# Patient Record
Sex: Male | Born: 1970
Health system: Southern US, Community
[De-identification: ages and names within clinical notes are randomized; demographics above are authoritative.]

## PROBLEM LIST (undated history)

## (undated) DIAGNOSIS — T7840XA Allergy, unspecified, initial encounter: Secondary | ICD-10-CM

## (undated) DIAGNOSIS — J189 Pneumonia, unspecified organism: Secondary | ICD-10-CM

## (undated) DIAGNOSIS — K589 Irritable bowel syndrome without diarrhea: Secondary | ICD-10-CM

## (undated) DIAGNOSIS — T07XXXA Unspecified multiple injuries, initial encounter: Secondary | ICD-10-CM

## (undated) DIAGNOSIS — N200 Calculus of kidney: Secondary | ICD-10-CM

## (undated) HISTORY — DX: Unspecified multiple injuries, initial encounter: T07.XXXA

## (undated) HISTORY — DX: Calculus of kidney: N20.0

## (undated) HISTORY — DX: Irritable bowel syndrome, unspecified: K58.9

## (undated) HISTORY — PX: TONSILLECTOMY: SUR1361

## (undated) HISTORY — DX: Allergy, unspecified, initial encounter: T78.40XA

## (undated) HISTORY — DX: Pneumonia, unspecified organism: J18.9

---

## 2001-04-17 ENCOUNTER — Emergency Department (HOSPITAL_COMMUNITY): Admission: EM | Admit: 2001-04-17 | Discharge: 2001-04-17 | Payer: Self-pay | Admitting: Internal Medicine

## 2001-04-17 ENCOUNTER — Encounter: Payer: Self-pay | Admitting: Internal Medicine

## 2001-04-22 ENCOUNTER — Encounter: Payer: Self-pay | Admitting: Orthopedic Surgery

## 2001-04-22 ENCOUNTER — Ambulatory Visit (HOSPITAL_COMMUNITY): Admission: RE | Admit: 2001-04-22 | Discharge: 2001-04-22 | Payer: Self-pay | Admitting: Orthopedic Surgery

## 2001-04-26 ENCOUNTER — Encounter: Payer: Self-pay | Admitting: Orthopedic Surgery

## 2001-04-26 ENCOUNTER — Ambulatory Visit (HOSPITAL_COMMUNITY): Admission: RE | Admit: 2001-04-26 | Discharge: 2001-04-26 | Payer: Self-pay | Admitting: Orthopedic Surgery

## 2003-09-04 ENCOUNTER — Ambulatory Visit (HOSPITAL_COMMUNITY): Admission: RE | Admit: 2003-09-04 | Discharge: 2003-09-04 | Payer: Self-pay | Admitting: Family Medicine

## 2003-09-06 ENCOUNTER — Ambulatory Visit (HOSPITAL_COMMUNITY): Admission: RE | Admit: 2003-09-06 | Discharge: 2003-09-06 | Payer: Self-pay | Admitting: Family Medicine

## 2006-02-16 ENCOUNTER — Encounter (HOSPITAL_COMMUNITY): Admission: RE | Admit: 2006-02-16 | Discharge: 2006-03-18 | Payer: Self-pay | Admitting: Preventative Medicine

## 2006-03-05 ENCOUNTER — Encounter: Payer: Self-pay | Admitting: Orthopedic Surgery

## 2006-04-14 ENCOUNTER — Ambulatory Visit: Payer: Self-pay | Admitting: Orthopedic Surgery

## 2007-08-04 HISTORY — PX: COLONOSCOPY: SHX174

## 2008-01-13 ENCOUNTER — Ambulatory Visit: Payer: Self-pay | Admitting: Gastroenterology

## 2008-01-18 ENCOUNTER — Ambulatory Visit (HOSPITAL_COMMUNITY): Admission: RE | Admit: 2008-01-18 | Discharge: 2008-01-18 | Payer: Self-pay | Admitting: Gastroenterology

## 2008-01-18 ENCOUNTER — Ambulatory Visit: Payer: Self-pay | Admitting: Gastroenterology

## 2009-04-16 ENCOUNTER — Encounter: Payer: Self-pay | Admitting: Orthopedic Surgery

## 2009-04-16 ENCOUNTER — Emergency Department (HOSPITAL_COMMUNITY): Admission: EM | Admit: 2009-04-16 | Discharge: 2009-04-16 | Payer: Self-pay | Admitting: Emergency Medicine

## 2009-04-17 ENCOUNTER — Encounter: Payer: Self-pay | Admitting: Orthopedic Surgery

## 2009-04-17 ENCOUNTER — Ambulatory Visit (HOSPITAL_COMMUNITY): Admission: RE | Admit: 2009-04-17 | Discharge: 2009-04-17 | Payer: Self-pay | Admitting: Emergency Medicine

## 2009-04-22 ENCOUNTER — Ambulatory Visit: Payer: Self-pay | Admitting: Orthopedic Surgery

## 2009-04-22 DIAGNOSIS — M25519 Pain in unspecified shoulder: Secondary | ICD-10-CM | POA: Insufficient documentation

## 2009-04-22 DIAGNOSIS — M25819 Other specified joint disorders, unspecified shoulder: Secondary | ICD-10-CM | POA: Insufficient documentation

## 2009-04-22 DIAGNOSIS — M7580 Other shoulder lesions, unspecified shoulder: Secondary | ICD-10-CM

## 2009-04-29 ENCOUNTER — Ambulatory Visit: Payer: Self-pay | Admitting: Orthopedic Surgery

## 2009-05-06 ENCOUNTER — Ambulatory Visit: Payer: Self-pay | Admitting: Orthopedic Surgery

## 2009-05-13 ENCOUNTER — Ambulatory Visit: Payer: Self-pay | Admitting: Orthopedic Surgery

## 2009-06-03 ENCOUNTER — Ambulatory Visit: Payer: Self-pay | Admitting: Orthopedic Surgery

## 2010-01-30 ENCOUNTER — Emergency Department (HOSPITAL_COMMUNITY)
Admission: EM | Admit: 2010-01-30 | Discharge: 2010-01-30 | Payer: Self-pay | Source: Home / Self Care | Admitting: Emergency Medicine

## 2010-08-06 ENCOUNTER — Ambulatory Visit (HOSPITAL_COMMUNITY)
Admission: RE | Admit: 2010-08-06 | Discharge: 2010-08-06 | Payer: Self-pay | Source: Home / Self Care | Attending: Family Medicine | Admitting: Family Medicine

## 2010-08-24 ENCOUNTER — Encounter: Payer: Self-pay | Admitting: Neurosurgery

## 2010-08-24 ENCOUNTER — Encounter: Payer: Self-pay | Admitting: Family Medicine

## 2010-10-19 LAB — DIFFERENTIAL
Basophils Absolute: 0 10*3/uL (ref 0.0–0.1)
Eosinophils Absolute: 0.2 10*3/uL (ref 0.0–0.7)
Eosinophils Relative: 2 % (ref 0–5)
Monocytes Absolute: 1.2 10*3/uL — ABNORMAL HIGH (ref 0.1–1.0)

## 2010-10-19 LAB — URINALYSIS, ROUTINE W REFLEX MICROSCOPIC
Bilirubin Urine: NEGATIVE
Glucose, UA: NEGATIVE mg/dL
Hgb urine dipstick: NEGATIVE
Ketones, ur: 15 mg/dL — AB
Protein, ur: NEGATIVE mg/dL
pH: 6 (ref 5.0–8.0)

## 2010-10-19 LAB — BASIC METABOLIC PANEL
BUN: 20 mg/dL (ref 6–23)
CO2: 25 mEq/L (ref 19–32)
Chloride: 104 mEq/L (ref 96–112)
Creatinine, Ser: 1.49 mg/dL (ref 0.4–1.5)
Glucose, Bld: 134 mg/dL — ABNORMAL HIGH (ref 70–99)
Potassium: 3.3 mEq/L — ABNORMAL LOW (ref 3.5–5.1)

## 2010-10-19 LAB — CBC
HCT: 44.5 % (ref 39.0–52.0)
MCH: 27.2 pg (ref 26.0–34.0)
MCV: 81.7 fL (ref 78.0–100.0)
Platelets: 261 10*3/uL (ref 150–400)
RDW: 13 % (ref 11.5–15.5)

## 2010-12-16 NOTE — Op Note (Signed)
NAMECREIGHTON, LONGLEY               ACCOUNT NO.:  [id number]   MEDICAL RECORD NO.:  [id number]          PATIENT TYPE:  AMB   LOCATION:  DAY                           FACILITY:  [facility]   PHYSICIAN:  Kassie Mends, M.D.      DATE OF BIRTH:  1971-02-20   DATE OF PROCEDURE:  01/18/2008  DATE OF DISCHARGE:                                PROCEDURE NOTE   REFERRING PHYSICIAN:  Scott A. Gerda Diss, MD   PROCEDURE:  Ileocolonoscopy.   INDICATION FOR EXAM:  Mr. Lavell is a 40 year old male who complained of  abdominal pain and rectal bleeding.   FINDINGS:  1. Normal distal terminal ileum, approximately 10 cm visualized.  2. Normal colon without evidence of polyps, masses, inflammatory      changes, or diverticular AVMs.  3. Normal retroflexed view of the rectum.   DIAGNOSES:  No source for Mr. Douglas abdominal pain or rectal bleeding  identified.   RECOMMENDATIONS:  1. Screening colonoscopy at age 48.  2. High-fiber diet.  He was given a handout on high-fiber diet.  If he      has irritable bowel seems to be constipation predominant, recommend      25-35 g of fiber daily.  He is cautioned that fiber may cause      bloating and discomfort and he should avoid items that cause this.   MEDICATIONS:  1. Demerol 100 mg IV.  2. Versed 4 mg IV.   PROCEDURE TECHNIQUE:  Physical exam was performed.  Informed consent was  obtained from the patient explaining the benefits, risks and  alternatives to the procedure.  The patient was connected to the monitor  and placed in left lateral position.  Continuous oxygen was provided by  nasal cannula and IV medicine administered through an indwelling  cannula.  After administration of sedation and rectal exam, the  patient's rectum was intubated.  The scope  was advanced under direct visualization to the distal terminal ileum.  The scope was removed slowly by carefully examining the color, texture,  anatomy and integrity of the mucosa on the way out.  The  patient was  recovered in endoscopy and discharged home in satisfactory condition.      Kassie Mends, M.D.  Electronically Signed     SM/MEDQ  D:  01/18/2008  T:  01/19/2008  Job:  [id number]   cc:   Lorin Picket A. Gerda Diss, MD  Fax: [phone]
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
## 2010-12-19 NOTE — Op Note (Signed)
NAME:  LIEM, COPENHAVER NO.:  [id number]   MEDICAL RECORD NO.:  [id number]                  PATIENT TYPE:   LOCATION:                                       FACILITY:   PHYSICIAN:  Donna Bernard, M.D.             DATE OF BIRTH:   DATE OF PROCEDURE:  09/06/2003  DATE OF DISCHARGE:                                    STRESS TEST   STRESS TEST   INDICATION FOR TEST:  Atypical chest pain.   The patient's resting EKG revealed normal sinus rhythm with no significant  ST-T changes.  He tolerated the first stage well.  During the second stage  the patient experienced some shortness of breath.  By the third stage the  patient had further fatigue develop.  He did make it 1 minute into the  fourth stage.  His maximum heart rate was 164.  He reached a METS exercise  level of 1218.  The patient surpasses the maximum predicted heart rate of  158. His maximum heart rate was 164.   At this point there were no significant ST-T changes noted at 0.08 seconds  past the J-point.   IMPRESSION:  Negative adequate stress test.   PLAN:  The patient to continue workup for symptoms and encouraged to press  on with exercise program.      ___________________________________________                                            Donna Bernard, M.D.   WSL/MEDQ  D:  09/30/2003  T:  09/30/2003  Job:  [id number]

## 2010-12-19 NOTE — Procedures (Signed)
NAME:  Joseph Lozano, Joseph Lozano                         ACCOUNT NO.:  [id number]   MEDICAL RECORD NO.:  [id number]                   PATIENT TYPE:  OUT   LOCATION:  RAD                                  FACILITY:  [facility]   PHYSICIAN:  Edward L. Juanetta Gosling, M.D.             DATE OF BIRTH:  August 20, 1970   DATE OF PROCEDURE:  DATE OF DISCHARGE:                              PULMONARY FUNCTION TEST   FINDINGS:  1. Spirometry is normal.  2. Lung volumes show normal total lung capacity and slight air trapping.  3. DLCO is normal.  4. Arterial blood gasses are normal.  5. The computer has suggested tracheal stenosis.  I really do not see     evidence of that, but if this is a strong clinical consideration, CT     scanning of the chest and trachea may be of some help.      ___________________________________________                                            Oneal Deputy. Juanetta Gosling, M.D.   Gwenlyn Found  D:  09/04/2003  T:  09/05/2003  Job:  [id number]   cc:   Donna Bernard, M.D.  [address]  [place]  [place] [address]  Fax: [phone]

## 2012-10-28 ENCOUNTER — Encounter: Payer: Self-pay | Admitting: *Deleted

## 2012-10-31 ENCOUNTER — Ambulatory Visit (INDEPENDENT_AMBULATORY_CARE_PROVIDER_SITE_OTHER): Payer: BC Managed Care – PPO | Admitting: Family Medicine

## 2012-10-31 ENCOUNTER — Encounter: Payer: Self-pay | Admitting: Family Medicine

## 2012-10-31 VITALS — BP 126/84 | HR 90 | Wt 184.0 lb

## 2012-10-31 DIAGNOSIS — R209 Unspecified disturbances of skin sensation: Secondary | ICD-10-CM

## 2012-10-31 DIAGNOSIS — M25511 Pain in right shoulder: Secondary | ICD-10-CM

## 2012-10-31 DIAGNOSIS — R200 Anesthesia of skin: Secondary | ICD-10-CM

## 2012-10-31 DIAGNOSIS — M25519 Pain in unspecified shoulder: Secondary | ICD-10-CM

## 2012-10-31 MED ORDER — HYDROCODONE-ACETAMINOPHEN 5-325 MG PO TABS: 1.0000 | ORAL_TABLET | Freq: Four times a day (QID) | ORAL | Status: DC | PRN

## 2012-10-31 NOTE — Patient Instructions (Signed)
Be sure to see ortho doc for follow up
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
## 2012-11-29 ENCOUNTER — Other Ambulatory Visit: Payer: Self-pay | Admitting: Family Medicine

## 2013-02-20 ENCOUNTER — Other Ambulatory Visit (HOSPITAL_COMMUNITY): Payer: Self-pay | Admitting: Sports Medicine

## 2013-02-20 DIAGNOSIS — M542 Cervicalgia: Secondary | ICD-10-CM

## 2013-02-20 DIAGNOSIS — R200 Anesthesia of skin: Secondary | ICD-10-CM

## 2013-02-20 DIAGNOSIS — R202 Paresthesia of skin: Secondary | ICD-10-CM

## 2013-02-22 ENCOUNTER — Encounter (HOSPITAL_COMMUNITY): Payer: Self-pay

## 2013-02-22 ENCOUNTER — Ambulatory Visit (HOSPITAL_COMMUNITY)
Admission: RE | Admit: 2013-02-22 | Discharge: 2013-02-22 | Disposition: A | Discharge status: Home / Self Care | Payer: BC Managed Care – PPO | Source: Ambulatory Visit | Attending: Sports Medicine | Admitting: Sports Medicine

## 2013-02-22 DIAGNOSIS — M5020 Other cervical disc displacement, unspecified cervical region: Secondary | ICD-10-CM | POA: Insufficient documentation

## 2013-02-22 DIAGNOSIS — R200 Anesthesia of skin: Secondary | ICD-10-CM

## 2013-02-22 DIAGNOSIS — R209 Unspecified disturbances of skin sensation: Secondary | ICD-10-CM | POA: Insufficient documentation

## 2013-02-22 DIAGNOSIS — M542 Cervicalgia: Secondary | ICD-10-CM | POA: Insufficient documentation

## 2013-02-23 ENCOUNTER — Other Ambulatory Visit (HOSPITAL_COMMUNITY): Payer: Self-pay

## 2013-06-06 DIAGNOSIS — Z029 Encounter for administrative examinations, unspecified: Secondary | ICD-10-CM

## 2013-08-15 ENCOUNTER — Ambulatory Visit (INDEPENDENT_AMBULATORY_CARE_PROVIDER_SITE_OTHER): Payer: BC Managed Care – PPO | Admitting: Family Medicine

## 2013-08-15 ENCOUNTER — Encounter: Payer: Self-pay | Admitting: Family Medicine

## 2013-08-15 VITALS — BP 128/82 | Temp 98.3°F | Ht 71.0 in | Wt 190.2 lb

## 2013-08-15 DIAGNOSIS — G4733 Obstructive sleep apnea (adult) (pediatric): Secondary | ICD-10-CM

## 2013-08-15 DIAGNOSIS — R400 Somnolence: Secondary | ICD-10-CM

## 2013-08-15 DIAGNOSIS — J310 Chronic rhinitis: Secondary | ICD-10-CM

## 2013-08-15 DIAGNOSIS — J329 Chronic sinusitis, unspecified: Secondary | ICD-10-CM

## 2013-08-15 DIAGNOSIS — R404 Transient alteration of awareness: Secondary | ICD-10-CM

## 2013-08-15 MED ORDER — AMOXICILLIN-POT CLAVULANATE 875-125 MG PO TABS: 1.0000 | ORAL_TABLET | Freq: Two times a day (BID) | ORAL | Status: AC

## 2013-08-15 NOTE — Progress Notes (Signed)
   Subjective:    Patient ID: Joseph Lozano, male    DOB: 07/10/1971, 43 y.o.   MRN: [id number]  Sinus Problem This is a new problem. The current episode started 1 to 4 weeks ago. Associated symptoms include congestion, headaches and sinus pressure.   Started a couple weeks ago, headache a little fever, constant pressure  No energy  Little sore throat off an on   Frontal and temples,  Steady in nature worse with cough  Pos exposures thru band Neg chest cong a little cough Pos sig snorer and trouble stopping breathing frequently at night. No fam hx of sleep apnea. Energy most days, diminished. Patient very concerned about sleep apnea. Wife also very concerned about sleep apnea. Patient does out questionnaire which is strongly positive for sleep apnea no known family history  Drowsy during the day, no hx of drowsiness with driving Review of Systems  HENT: Positive for congestion and sinus pressure.   Neurological: Positive for headaches.   No chest pain no back pain no abdominal pain some weight gain over the years ROS otherwise negative    Objective:   Physical Exam  Alert mild malaise. Vitals reviewed. H&T moderate his congestion neck supple. Lungs clear. Heart regular in rhythm.      Assessment & Plan:  Impression 1 rhinosinusitis #2 probable sleep apnea discussed at length. Plan appropriate antibiotics prescribed. Symptomatic care discussed. Warning signs discussed. Initiate workup for sleep apnea. Sleep study to be scheduled. WSL

## 2013-08-17 DIAGNOSIS — G4733 Obstructive sleep apnea (adult) (pediatric): Secondary | ICD-10-CM | POA: Insufficient documentation

## 2013-09-07 ENCOUNTER — Telehealth: Payer: Self-pay | Admitting: Family Medicine

## 2013-09-07 NOTE — Telephone Encounter (Signed)
Patient calling to get status on sleep study referral

## 2013-09-11 NOTE — Telephone Encounter (Signed)
Called pt-explained referral faxed to [facility] today, they will call him to set up sleep study, pt verbalized understanding

## 2013-09-12 ENCOUNTER — Other Ambulatory Visit: Payer: Self-pay

## 2013-09-12 DIAGNOSIS — G4730 Sleep apnea, unspecified: Secondary | ICD-10-CM

## 2013-09-12 DIAGNOSIS — R0683 Snoring: Secondary | ICD-10-CM

## 2014-04-12 ENCOUNTER — Ambulatory Visit (INDEPENDENT_AMBULATORY_CARE_PROVIDER_SITE_OTHER): Payer: BC Managed Care – PPO | Admitting: Family Medicine

## 2014-04-12 ENCOUNTER — Encounter: Payer: Self-pay | Admitting: Family Medicine

## 2014-04-12 VITALS — BP 110/78 | Ht 71.0 in | Wt 189.0 lb

## 2014-04-12 DIAGNOSIS — M542 Cervicalgia: Secondary | ICD-10-CM

## 2014-04-12 MED ORDER — HYDROCODONE-ACETAMINOPHEN 5-325 MG PO TABS: 1.0000 | ORAL_TABLET | Freq: Every evening | ORAL | Status: DC | PRN

## 2014-04-12 MED ORDER — PREDNISONE 20 MG PO TABS: ORAL_TABLET | ORAL | Status: DC

## 2014-04-12 MED ORDER — CHLORZOXAZONE 500 MG PO TABS: 500.0000 mg | ORAL_TABLET | Freq: Three times a day (TID) | ORAL | Status: DC | PRN
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
## 2015-08-27 ENCOUNTER — Telehealth: Payer: Self-pay | Admitting: Family Medicine

## 2015-08-27 DIAGNOSIS — Z139 Encounter for screening, unspecified: Secondary | ICD-10-CM

## 2015-08-27 NOTE — Telephone Encounter (Signed)
Pt is requesting lab orders to be sent over for an upcoming wellness visit. There are no past labs in epic.

## 2015-08-27 NOTE — Telephone Encounter (Signed)
Lipid liver met 7 PSA

## 2015-08-27 NOTE — Telephone Encounter (Signed)
Called patient and informed him per Dr.Steve Milford ordered. Informed patient to be fasting before having labs drawn. Patient verbalized understanding.

## 2015-09-04 LAB — HEPATIC FUNCTION PANEL
ALK PHOS: 60 IU/L (ref 39–117)
ALT: 23 IU/L (ref 0–44)
AST: 21 IU/L (ref 0–40)
Albumin: 4.7 g/dL (ref 3.5–5.5)
Bilirubin Total: 1.1 mg/dL (ref 0.0–1.2)
Bilirubin, Direct: 0.26 mg/dL (ref 0.00–0.40)
TOTAL PROTEIN: 7.1 g/dL (ref 6.0–8.5)

## 2015-09-04 LAB — PSA: Prostate Specific Ag, Serum: 0.7 ng/mL (ref 0.0–4.0)

## 2015-09-04 LAB — LIPID PANEL
Chol/HDL Ratio: 2.8 ratio units (ref 0.0–5.0)
Cholesterol, Total: 178 mg/dL (ref 100–199)
HDL: 64 mg/dL (ref >39)
LDL Calculated: 98 mg/dL (ref 0–99)
Triglycerides: 81 mg/dL (ref 0–149)
VLDL CHOLESTEROL CAL: 16 mg/dL (ref 5–40)

## 2015-09-04 LAB — BASIC METABOLIC PANEL
BUN / CREAT RATIO: 13 (ref 9–20)
BUN: 15 mg/dL (ref 6–24)
CO2: 25 mmol/L (ref 18–29)
Calcium: 9.7 mg/dL (ref 8.7–10.2)
Chloride: 101 mmol/L (ref 96–106)
Creatinine, Ser: 1.15 mg/dL (ref 0.76–1.27)
GFR, EST AFRICAN AMERICAN: 88 mL/min/{1.73_m2} (ref >59)
GFR, EST NON AFRICAN AMERICAN: 76 mL/min/{1.73_m2} (ref >59)
GLUCOSE: 96 mg/dL (ref 65–99)
Potassium: 4.5 mmol/L (ref 3.5–5.2)
Sodium: 141 mmol/L (ref 134–144)

## 2015-09-09 ENCOUNTER — Encounter: Payer: Self-pay | Admitting: Family Medicine

## 2015-09-09 ENCOUNTER — Ambulatory Visit (INDEPENDENT_AMBULATORY_CARE_PROVIDER_SITE_OTHER): Payer: BLUE CROSS/BLUE SHIELD | Admitting: Family Medicine

## 2015-09-09 VITALS — BP 128/82 | Ht 71.0 in | Wt 187.4 lb

## 2015-09-09 DIAGNOSIS — Z0000 Encounter for general adult medical examination without abnormal findings: Secondary | ICD-10-CM | POA: Diagnosis not present

## 2015-09-09 NOTE — Progress Notes (Signed)
Subjective:    Patient ID: Joseph Lozano, male    DOB: 07-16-1971, 45 y.o.   MRN: [id number]  HPI The patient comes in today for a wellness visit.    A review of their health history was completed.  A review of medications was also completed.  Any needed refills; no  Eating habits: yes  Falls/  MVA accidents in past few months: no  Regular exercise: yes- running 3 days a week  Specialist pt sees on regular basis: no  Preventative health issues were discussed.   Additional concerns: check place on chest-notices when doing sit ups  Results for orders placed or performed in visit on 08/27/15  Lipid panel  Result Value Ref Range   Cholesterol, Total 178 100 - 199 mg/dL   Triglycerides 81 0 - 149 mg/dL   HDL 64 >39 mg/dL   VLDL Cholesterol Cal 16 5 - 40 mg/dL   LDL Calculated 98 0 - 99 mg/dL   Chol/HDL Ratio 2.8 0.0 - 5.0 ratio units  Hepatic function panel  Result Value Ref Range   Total Protein 7.1 6.0 - 8.5 g/dL   Albumin 4.7 3.5 - 5.5 g/dL   Bilirubin Total 1.1 0.0 - 1.2 mg/dL   Bilirubin, Direct 0.26 0.00 - 0.40 mg/dL   Alkaline Phosphatase 60 39 - 117 IU/L   AST 21 0 - 40 IU/L   ALT 23 0 - 44 IU/L  Basic metabolic panel  Result Value Ref Range   Glucose 96 65 - 99 mg/dL   BUN 15 6 - 24 mg/dL   Creatinine, Ser 1.15 0.76 - 1.27 mg/dL   GFR calc non Af Amer 76 >59 mL/min/1.73   GFR calc Af Amer 88 >59 mL/min/1.73   BUN/Creatinine Ratio 13 9 - 20   Sodium 141 134 - 144 mmol/L   Potassium 4.5 3.5 - 5.2 mmol/L   Chloride 101 96 - 106 mmol/L   CO2 25 18 - 29 mmol/L   Calcium 9.7 8.7 - 10.2 mg/dL  PSA  Result Value Ref Range   Prostate Specific Ag, Serum 0.7 0.0 - 4.0 ng/mL   Maintains regular exrcise with the [place]  No sig alcohol intake  Can wait til fivfty for next colonoscopy   Review of Systems  Constitutional: Negative for fever, activity change and appetite change.  HENT: Negative for congestion and rhinorrhea.     Eyes: Negative for discharge.  Respiratory: Negative for cough and wheezing.   Cardiovascular: Negative for chest pain.  Gastrointestinal: Negative for vomiting, abdominal pain and blood in stool.  Genitourinary: Negative for frequency and difficulty urinating.  Musculoskeletal: Negative for neck pain.  Skin: Negative for rash.  Allergic/Immunologic: Negative for environmental allergies and food allergies.  Neurological: Negative for weakness and headaches.  Psychiatric/Behavioral: Negative for agitation.  All other systems reviewed and are negative.      Objective:   Physical Exam  Constitutional: He appears well-developed and well-nourished.  HENT:  Head: Normocephalic and atraumatic.  Right Ear: External ear normal.  Left Ear: External ear normal.  Nose: Nose normal.  Mouth/Throat: Oropharynx is clear and moist.  Eyes: EOM are normal. Pupils are equal, round, and reactive to light.  Neck: Normal range of motion. Neck supple. No thyromegaly present.  Cardiovascular: Normal rate, regular rhythm and normal heart sounds.   No murmur heard. Pulmonary/Chest: Effort normal and breath sounds normal. No respiratory distress. He has no wheezes.  Abdominal: Soft. Bowel sounds are normal.  He exhibits no distension and no mass. There is no tenderness.  Genitourinary: Penis normal.  Musculoskeletal: Normal range of motion. He exhibits no edema.  Lymphadenopathy:    He has no cervical adenopathy.  Neurological: He is alert. He exhibits normal muscle tone.  Skin: Skin is warm and dry. No erythema.  Psychiatric: He has a normal mood and affect. His behavior is normal. Judgment normal.  Vitals reviewed.   mild thinning mid abdominal musculature upper abdomen within normal limits she reassured   multiple atypical nevi none which appear alarming but to due to numerous lesions would recommend regular dermatology visits     Assessment & Plan:   impression wellness exam #2 normal muscular  changes of abdomen discussed #3 IBS stable plan blood work discussed. Diet exercise discussed. Not due for colonoscopy  Until 48 regular dermatology visits recommended names given

## 2015-09-09 NOTE — Patient Instructions (Signed)
Results for orders placed or performed in visit on 08/27/15  Lipid panel  Result Value Ref Range   Cholesterol, Total 178 100 - 199 mg/dL   Triglycerides 81 0 - 149 mg/dL   HDL 64 >39 mg/dL   VLDL Cholesterol Cal 16 5 - 40 mg/dL   LDL Calculated 98 0 - 99 mg/dL   Chol/HDL Ratio 2.8 0.0 - 5.0 ratio units  Hepatic function panel  Result Value Ref Range   Total Protein 7.1 6.0 - 8.5 g/dL   Albumin 4.7 3.5 - 5.5 g/dL   Bilirubin Total 1.1 0.0 - 1.2 mg/dL   Bilirubin, Direct 0.26 0.00 - 0.40 mg/dL   Alkaline Phosphatase 60 39 - 117 IU/L   AST 21 0 - 40 IU/L   ALT 23 0 - 44 IU/L  Basic metabolic panel  Result Value Ref Range   Glucose 96 65 - 99 mg/dL   BUN 15 6 - 24 mg/dL   Creatinine, Ser 1.15 0.76 - 1.27 mg/dL   GFR calc non Af Amer 76 >59 mL/min/1.73   GFR calc Af Amer 88 >59 mL/min/1.73   BUN/Creatinine Ratio 13 9 - 20   Sodium 141 134 - 144 mmol/L   Potassium 4.5 3.5 - 5.2 mmol/L   Chloride 101 96 - 106 mmol/L   CO2 25 18 - 29 mmol/L   Calcium 9.7 8.7 - 10.2 mg/dL  PSA  Result Value Ref Range   Prostate Specific Ag, Serum 0.7 0.0 - 4.0 ng/mL

## 2015-09-23 ENCOUNTER — Ambulatory Visit: Payer: BLUE CROSS/BLUE SHIELD | Admitting: Family Medicine

## 2015-09-23 ENCOUNTER — Encounter: Payer: Self-pay | Admitting: Family Medicine

## 2015-09-23 ENCOUNTER — Ambulatory Visit (INDEPENDENT_AMBULATORY_CARE_PROVIDER_SITE_OTHER): Payer: BLUE CROSS/BLUE SHIELD | Admitting: Family Medicine

## 2015-09-23 VITALS — BP 120/80 | Temp 98.2°F | Ht 71.0 in | Wt 184.0 lb

## 2015-09-23 DIAGNOSIS — J4521 Mild intermittent asthma with (acute) exacerbation: Secondary | ICD-10-CM

## 2015-09-23 DIAGNOSIS — J329 Chronic sinusitis, unspecified: Secondary | ICD-10-CM

## 2015-09-23 DIAGNOSIS — J310 Chronic rhinitis: Secondary | ICD-10-CM

## 2015-09-23 MED ORDER — ALBUTEROL SULFATE HFA 108 (90 BASE) MCG/ACT IN AERS: 2.0000 | INHALATION_SPRAY | Freq: Four times a day (QID) | RESPIRATORY_TRACT | Status: DC | PRN

## 2015-09-23 MED ORDER — AMOXICILLIN-POT CLAVULANATE 875-125 MG PO TABS: 1.0000 | ORAL_TABLET | Freq: Two times a day (BID) | ORAL | Status: AC

## 2015-09-23 NOTE — Progress Notes (Signed)
   Subjective:    Patient ID: Joseph Lozano, male    DOB: [birth year], 45 y.o.   MRN: [id number]  URI  This is a new problem. The current episode started 1 to 4 weeks ago. The problem has been unchanged. There has been no fever. Associated symptoms include congestion, coughing, a sore throat and wheezing. Associated symptoms comments: Shortness of breath . Treatments tried: albuterol treatments. The treatment provided no relief.   Patient states that he has no other concerns at this time.   Wheezing and sob for it   Frontal tnderness and pain , sore from coughing and achey   Review of Systems  HENT: Positive for congestion and sore throat.   Respiratory: Positive for cough and wheezing.        Objective:   Physical Exam  Alert mild malaise. Vitals stable. HEENT moderate nasal congestion frontal turns pharynx normal lungs reactive airways with cough no crackles no tachypnea heart regular in rhythm.      Assessment & Plan:  Impression post viral rhinosinusitis/bronchitis with element of reactive airways plan albuterol 2 sprays every 4 6. Symptom care discussed. Antibiotics prescribed. WSL

## 2015-10-03 ENCOUNTER — Telehealth: Payer: Self-pay | Admitting: Family Medicine

## 2015-10-03 MED ORDER — LEVOFLOXACIN 500 MG PO TABS: 500.0000 mg | ORAL_TABLET | Freq: Every day | ORAL | Status: DC

## 2015-10-03 NOTE — Telephone Encounter (Signed)
Pt was seen 2/20 and would like to know since he has finished his antibiotic  And still having issues with cough, and chest congestion. Productive cough Using mucinex but not clearing it.   You said to call back if it didn't clear up to call back so he would like something called into   [facility]

## 2015-10-03 NOTE — Telephone Encounter (Signed)
Patient seen 2/20 diagnosed with rhinosinusitis/bronchitis, prescribed Augmentin 875 mg BID x 10 days. Still has cough and chest congestion.

## 2015-10-03 NOTE — Telephone Encounter (Signed)
Lev 500 one qd for ten d

## 2015-10-03 NOTE — Telephone Encounter (Signed)
Med sent to pharmacy. Patient was notified.  

## 2015-11-04 DIAGNOSIS — D485 Neoplasm of uncertain behavior of skin: Secondary | ICD-10-CM | POA: Diagnosis not present

## 2015-11-04 DIAGNOSIS — L089 Local infection of the skin and subcutaneous tissue, unspecified: Secondary | ICD-10-CM | POA: Diagnosis not present

## 2015-11-04 DIAGNOSIS — D2261 Melanocytic nevi of right upper limb, including shoulder: Secondary | ICD-10-CM | POA: Diagnosis not present

## 2015-11-13 ENCOUNTER — Telehealth: Payer: Self-pay | Admitting: Family Medicine

## 2015-11-13 NOTE — Telephone Encounter (Signed)
error 

## 2015-12-16 DIAGNOSIS — D485 Neoplasm of uncertain behavior of skin: Secondary | ICD-10-CM | POA: Diagnosis not present

## 2015-12-16 DIAGNOSIS — D225 Melanocytic nevi of trunk: Secondary | ICD-10-CM | POA: Diagnosis not present

## 2015-12-25 DIAGNOSIS — D239 Other benign neoplasm of skin, unspecified: Secondary | ICD-10-CM | POA: Diagnosis not present

## 2015-12-25 DIAGNOSIS — D225 Melanocytic nevi of trunk: Secondary | ICD-10-CM | POA: Diagnosis not present

## 2015-12-25 DIAGNOSIS — D485 Neoplasm of uncertain behavior of skin: Secondary | ICD-10-CM | POA: Diagnosis not present

## 2016-02-05 DIAGNOSIS — D485 Neoplasm of uncertain behavior of skin: Secondary | ICD-10-CM | POA: Diagnosis not present

## 2016-02-05 DIAGNOSIS — D225 Melanocytic nevi of trunk: Secondary | ICD-10-CM | POA: Diagnosis not present

## 2016-02-05 DIAGNOSIS — L905 Scar conditions and fibrosis of skin: Secondary | ICD-10-CM | POA: Diagnosis not present

## 2016-02-05 DIAGNOSIS — D2262 Melanocytic nevi of left upper limb, including shoulder: Secondary | ICD-10-CM | POA: Diagnosis not present

## 2016-07-31 ENCOUNTER — Encounter (HOSPITAL_COMMUNITY): Payer: Self-pay | Admitting: Emergency Medicine

## 2016-07-31 ENCOUNTER — Emergency Department (HOSPITAL_COMMUNITY): Payer: BLUE CROSS/BLUE SHIELD

## 2016-07-31 ENCOUNTER — Emergency Department (HOSPITAL_COMMUNITY)
Admission: EM | Admit: 2016-07-31 | Discharge: 2016-07-31 | Disposition: A | Discharge status: Home / Self Care | Payer: BLUE CROSS/BLUE SHIELD | Attending: Emergency Medicine | Admitting: Emergency Medicine

## 2016-07-31 DIAGNOSIS — S161XXA Strain of muscle, fascia and tendon at neck level, initial encounter: Secondary | ICD-10-CM | POA: Diagnosis not present

## 2016-07-31 DIAGNOSIS — S199XXA Unspecified injury of neck, initial encounter: Secondary | ICD-10-CM | POA: Diagnosis not present

## 2016-07-31 DIAGNOSIS — Y92410 Unspecified street and highway as the place of occurrence of the external cause: Secondary | ICD-10-CM | POA: Diagnosis not present

## 2016-07-31 DIAGNOSIS — S86112A Strain of other muscle(s) and tendon(s) of posterior muscle group at lower leg level, left leg, initial encounter: Secondary | ICD-10-CM

## 2016-07-31 DIAGNOSIS — S86812A Strain of other muscle(s) and tendon(s) at lower leg level, left leg, initial encounter: Secondary | ICD-10-CM | POA: Diagnosis not present

## 2016-07-31 DIAGNOSIS — Y999 Unspecified external cause status: Secondary | ICD-10-CM | POA: Insufficient documentation

## 2016-07-31 DIAGNOSIS — Y9389 Activity, other specified: Secondary | ICD-10-CM | POA: Insufficient documentation

## 2016-07-31 DIAGNOSIS — M542 Cervicalgia: Secondary | ICD-10-CM | POA: Diagnosis not present

## 2016-07-31 MED ORDER — CYCLOBENZAPRINE HCL 10 MG PO TABS: 10.0000 mg | ORAL_TABLET | Freq: Three times a day (TID) | ORAL | 0 refills | Status: DC | PRN

## 2016-07-31 MED ORDER — HYDROCODONE-ACETAMINOPHEN 5-325 MG PO TABS: ORAL_TABLET | ORAL | 0 refills | Status: DC

## 2016-07-31 MED ORDER — METHOCARBAMOL 500 MG PO TABS
500.0000 mg | ORAL_TABLET | Freq: Once | ORAL | Status: AC
  Administered 2016-07-31: 500 mg via ORAL
  Filled 2016-07-31: qty 1

## 2016-07-31 NOTE — ED Triage Notes (Signed)
Neck collar place on pt.

## 2016-07-31 NOTE — Discharge Instructions (Signed)
Apply ice packs on/off to your neck and calf.  Use your crutches for weight bearing.  Call Dr. Ruthe Mannan office to arrange a follow-up appt.

## 2016-07-31 NOTE — ED Notes (Signed)
Patient transported to CT 
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
## 2016-07-31 NOTE — ED Notes (Signed)
C-collar applied in triage.

## 2016-08-04 ENCOUNTER — Encounter: Payer: Self-pay | Admitting: Orthopaedic Surgery

## 2016-08-04 ENCOUNTER — Ambulatory Visit (INDEPENDENT_AMBULATORY_CARE_PROVIDER_SITE_OTHER): Payer: BLUE CROSS/BLUE SHIELD | Admitting: Orthopaedic Surgery

## 2016-08-04 VITALS — BP 132/81 | HR 84 | Temp 97.9°F | Ht 69.5 in | Wt 189.0 lb

## 2016-08-04 DIAGNOSIS — M542 Cervicalgia: Secondary | ICD-10-CM

## 2016-08-04 DIAGNOSIS — S86112A Strain of other muscle(s) and tendon(s) of posterior muscle group at lower leg level, left leg, initial encounter: Secondary | ICD-10-CM | POA: Diagnosis not present
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
## 2016-08-05 ENCOUNTER — Ambulatory Visit: Payer: BLUE CROSS/BLUE SHIELD | Admitting: Family Medicine

## 2016-08-11 ENCOUNTER — Ambulatory Visit (INDEPENDENT_AMBULATORY_CARE_PROVIDER_SITE_OTHER): Payer: BLUE CROSS/BLUE SHIELD | Admitting: Orthopaedic Surgery

## 2016-08-11 VITALS — BP 111/75 | HR 70 | Temp 98.1°F | Ht 69.5 in | Wt 189.0 lb

## 2016-08-11 DIAGNOSIS — M545 Low back pain, unspecified: Secondary | ICD-10-CM

## 2016-08-11 DIAGNOSIS — S86112A Strain of other muscle(s) and tendon(s) of posterior muscle group at lower leg level, left leg, initial encounter: Secondary | ICD-10-CM | POA: Diagnosis not present

## 2016-08-11 DIAGNOSIS — M542 Cervicalgia: Secondary | ICD-10-CM | POA: Diagnosis not present

## 2016-08-11 MED ORDER — IBUPROFEN 800 MG PO TABS
800.0000 mg | ORAL_TABLET | Freq: Three times a day (TID) | ORAL | 5 refills | Status: DC | PRN
Start: 2016-08-11 — End: 2018-05-16
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
## 2016-08-17 DIAGNOSIS — R2689 Other abnormalities of gait and mobility: Secondary | ICD-10-CM | POA: Diagnosis not present

## 2016-08-17 DIAGNOSIS — M79662 Pain in left lower leg: Secondary | ICD-10-CM | POA: Diagnosis not present

## 2016-08-17 DIAGNOSIS — M545 Low back pain: Secondary | ICD-10-CM | POA: Diagnosis not present

## 2016-08-18 DIAGNOSIS — M79662 Pain in left lower leg: Secondary | ICD-10-CM | POA: Diagnosis not present

## 2016-08-18 DIAGNOSIS — M545 Low back pain: Secondary | ICD-10-CM | POA: Diagnosis not present

## 2016-08-18 DIAGNOSIS — R2689 Other abnormalities of gait and mobility: Secondary | ICD-10-CM | POA: Diagnosis not present

## 2016-08-21 DIAGNOSIS — M79662 Pain in left lower leg: Secondary | ICD-10-CM | POA: Diagnosis not present

## 2016-08-21 DIAGNOSIS — R2689 Other abnormalities of gait and mobility: Secondary | ICD-10-CM | POA: Diagnosis not present

## 2016-08-21 DIAGNOSIS — M545 Low back pain: Secondary | ICD-10-CM | POA: Diagnosis not present

## 2016-08-24 DIAGNOSIS — R2689 Other abnormalities of gait and mobility: Secondary | ICD-10-CM | POA: Diagnosis not present

## 2016-08-24 DIAGNOSIS — M545 Low back pain: Secondary | ICD-10-CM | POA: Diagnosis not present

## 2016-08-24 DIAGNOSIS — M79662 Pain in left lower leg: Secondary | ICD-10-CM | POA: Diagnosis not present

## 2016-08-25 DIAGNOSIS — R2689 Other abnormalities of gait and mobility: Secondary | ICD-10-CM | POA: Diagnosis not present

## 2016-08-25 DIAGNOSIS — M79662 Pain in left lower leg: Secondary | ICD-10-CM | POA: Diagnosis not present

## 2016-08-25 DIAGNOSIS — M545 Low back pain: Secondary | ICD-10-CM | POA: Diagnosis not present

## 2016-08-28 DIAGNOSIS — R2689 Other abnormalities of gait and mobility: Secondary | ICD-10-CM | POA: Diagnosis not present

## 2016-08-28 DIAGNOSIS — M545 Low back pain: Secondary | ICD-10-CM | POA: Diagnosis not present

## 2016-08-28 DIAGNOSIS — M79662 Pain in left lower leg: Secondary | ICD-10-CM | POA: Diagnosis not present

## 2016-09-01 ENCOUNTER — Encounter: Payer: Self-pay | Admitting: Family Medicine

## 2016-09-01 ENCOUNTER — Ambulatory Visit: Payer: BLUE CROSS/BLUE SHIELD | Admitting: Orthopaedic Surgery

## 2016-09-07 DIAGNOSIS — R2689 Other abnormalities of gait and mobility: Secondary | ICD-10-CM | POA: Diagnosis not present

## 2016-09-07 DIAGNOSIS — M79662 Pain in left lower leg: Secondary | ICD-10-CM | POA: Diagnosis not present

## 2016-09-07 DIAGNOSIS — M545 Low back pain: Secondary | ICD-10-CM | POA: Diagnosis not present

## 2016-09-08 DIAGNOSIS — R2689 Other abnormalities of gait and mobility: Secondary | ICD-10-CM | POA: Diagnosis not present

## 2016-09-08 DIAGNOSIS — M545 Low back pain: Secondary | ICD-10-CM | POA: Diagnosis not present

## 2016-09-08 DIAGNOSIS — M79662 Pain in left lower leg: Secondary | ICD-10-CM | POA: Diagnosis not present

## 2016-09-09 ENCOUNTER — Ambulatory Visit (INDEPENDENT_AMBULATORY_CARE_PROVIDER_SITE_OTHER): Payer: BLUE CROSS/BLUE SHIELD | Admitting: Orthopaedic Surgery

## 2016-09-09 ENCOUNTER — Encounter: Payer: Self-pay | Admitting: Orthopaedic Surgery

## 2016-09-09 VITALS — BP 112/73 | HR 66 | Temp 97.3°F | Ht 69.5 in | Wt 189.0 lb

## 2016-09-09 DIAGNOSIS — M542 Cervicalgia: Secondary | ICD-10-CM

## 2016-09-09 NOTE — Progress Notes (Signed)
Patient [id number] Joseph Lozano, male DOB:[birth year], 46 y.o. [id number]  Chief Complaint  Patient presents with  . Follow-up    neck pain, left leg pain    HPI  Joseph Lozano is a 46 y.o. male who has worsening neck pain and paresthesias now on the left side and left arm.  He has been to PT/OT and is not improving.  I have reviewed their notes.  He is taking his medicine.  With the appearance of left side paresthesias and no improvement with the PT and medicine, I will order a MRI of his cervical spine. His left gastrocnemius is much improved and he is walking better. HPI  Body mass index is 27.51 kg/m.  ROS  Review of Systems  HENT: Negative for congestion.   Respiratory: Negative for cough and shortness of breath.   Cardiovascular: Negative for chest pain and leg swelling.  Endocrine: Negative for cold intolerance.  Musculoskeletal: Positive for arthralgias, gait problem, joint swelling and neck pain.  Allergic/Immunologic: Negative for environmental allergies.    Past Medical History:  Diagnosis Date  . Fractures   . IBS (irritable bowel syndrome)   . Kidney stone   . Pneumonia     Past Surgical History:  Procedure Laterality Date  . TONSILLECTOMY      Family History  Problem Relation Age of Onset  . Cancer Mother     Breast, lung  . Hypertension Maternal Grandmother   . Heart attack Maternal Grandmother     Social History Social History  Substance Use Topics  . Smoking status: Never Smoker  . Smokeless tobacco: Never Used  . Alcohol use No    Allergies  Allergen Reactions  . Aspirin   . Nsaids   . Sulfonamide Derivatives     Current Outpatient Prescriptions  Medication Sig Dispense Refill  . albuterol (PROVENTIL HFA;VENTOLIN HFA) 108 (90 Base) MCG/ACT inhaler Inhale 2 puffs into the lungs every 6 (six) hours as needed for wheezing or shortness of breath. 1 Inhaler 2  . cyclobenzaprine (FLEXERIL) 10 MG tablet Take 1 tablet (10 mg total) by mouth 3  (three) times daily as needed. 21 tablet 0  . HYDROcodone-acetaminophen (NORCO/VICODIN) 5-325 MG tablet Take one-two tabs po q 4-6 hrs prn pain 12 tablet 0  . ibuprofen (ADVIL,MOTRIN) 800 MG tablet Take 1 tablet (800 mg total) by mouth every 8 (eight) hours as needed. 90 tablet 5  . levofloxacin (LEVAQUIN) 500 MG tablet Take 1 tablet (500 mg total) by mouth daily. For 10 days 10 tablet 0   No current facility-administered medications for this visit.      Physical Exam  Blood pressure 112/73, pulse 66, temperature 97.3 F (36.3 C), height 5' 9.5" (1.765 m), weight 189 lb (85.7 kg).  Constitutional: overall normal hygiene, normal nutrition, well developed, normal grooming, normal body habitus. Assistive device:none  Musculoskeletal: gait and station Limp none, muscle tone and strength are normal, no tremors or atrophy is present.  .  Neurological: coordination overall normal.  Deep tendon reflex/nerve stretch intact.  Sensation normal.  Cranial nerves II-XII intact.   Skin:   Normal overall no scars, lesions, ulcers or rashes. No psoriasis.  Psychiatric: Alert and oriented x 3.  Recent memory intact, remote memory unclear.  Normal mood and affect. Well groomed.  Good eye contact.  Cardiovascular: overall no swelling, no varicosities, no edema bilaterally, normal temperatures of the legs and arms, no clubbing, cyanosis and good capillary refill.  Lymphatic: palpation is normal.  He has  no limp on the left today.  He has just only slight tenderness of the left medial calf.  The neck is more tender on the left side with tightness of the left upper trapezius.  ROM is painful.  ROM of the left shoulder is full.  NV is intact. Grips are normal.  The patient has been educated about the nature of the problem(s) and counseled on treatment options.  The patient appeared to understand what I have discussed and is in agreement with it.  Encounter Diagnosis  Name Primary?  . Neck pain Yes  He  also has resolving tear of the left gastrocnemius muscle medially.  PLAN Call if any problems.  Precautions discussed.  Continue current medications.   Return to clinic after MRI of the neck   Electronically Signed Sanjuana Kava, MD 2/7/201811:50 AM

## 2016-09-21 ENCOUNTER — Ambulatory Visit
Admission: RE | Admit: 2016-09-21 | Discharge: 2016-09-21 | Disposition: A | Discharge status: Home / Self Care | Payer: BLUE CROSS/BLUE SHIELD | Source: Ambulatory Visit | Attending: Orthopaedic Surgery | Admitting: Orthopaedic Surgery

## 2016-09-21 DIAGNOSIS — M542 Cervicalgia: Secondary | ICD-10-CM | POA: Diagnosis present

## 2016-09-21 DIAGNOSIS — M4802 Spinal stenosis, cervical region: Secondary | ICD-10-CM | POA: Diagnosis not present

## 2016-09-21 DIAGNOSIS — M50322 Other cervical disc degeneration at C5-C6 level: Secondary | ICD-10-CM | POA: Diagnosis not present

## 2016-09-21 DIAGNOSIS — M50221 Other cervical disc displacement at C4-C5 level: Secondary | ICD-10-CM | POA: Diagnosis not present

## 2016-09-22 ENCOUNTER — Encounter: Payer: Self-pay | Admitting: Orthopaedic Surgery

## 2016-09-22 ENCOUNTER — Ambulatory Visit (INDEPENDENT_AMBULATORY_CARE_PROVIDER_SITE_OTHER): Payer: BLUE CROSS/BLUE SHIELD | Admitting: Orthopaedic Surgery

## 2016-09-22 VITALS — BP 123/83 | HR 70 | Temp 97.4°F | Ht 69.5 in | Wt 188.0 lb

## 2016-09-22 DIAGNOSIS — M542 Cervicalgia: Secondary | ICD-10-CM | POA: Diagnosis not present
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
## 2016-09-28 ENCOUNTER — Telehealth: Payer: Self-pay | Admitting: Orthopaedic Surgery

## 2016-09-28 NOTE — Telephone Encounter (Signed)
Joseph Lozano called and wanted to know if you would write a prescription for him to have massage therapy.  He said he went to one session and that it gave him some relief at that time.  He said insurance would pay for it with prescription written by a physician.  He also stated that he does have an appointment with neurosurgeon on Monday, March 12th.

## 2016-10-12 DIAGNOSIS — Z6828 Body mass index (BMI) 28.0-28.9, adult: Secondary | ICD-10-CM | POA: Diagnosis not present

## 2016-10-12 DIAGNOSIS — M542 Cervicalgia: Secondary | ICD-10-CM | POA: Diagnosis not present

## 2016-10-12 DIAGNOSIS — R030 Elevated blood-pressure reading, without diagnosis of hypertension: Secondary | ICD-10-CM | POA: Diagnosis not present

## 2016-10-27 ENCOUNTER — Ambulatory Visit (INDEPENDENT_AMBULATORY_CARE_PROVIDER_SITE_OTHER): Payer: BLUE CROSS/BLUE SHIELD | Admitting: Family Medicine

## 2016-10-27 ENCOUNTER — Encounter: Payer: Self-pay | Admitting: Family Medicine

## 2016-10-27 VITALS — BP 110/80 | Temp 98.1°F | Ht 71.0 in | Wt 189.0 lb

## 2016-10-27 DIAGNOSIS — H9201 Otalgia, right ear: Secondary | ICD-10-CM
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
## 2016-11-03 DIAGNOSIS — M5412 Radiculopathy, cervical region: Secondary | ICD-10-CM | POA: Diagnosis not present

## 2016-11-03 DIAGNOSIS — M542 Cervicalgia: Secondary | ICD-10-CM | POA: Diagnosis not present

## 2016-12-01 DIAGNOSIS — M5412 Radiculopathy, cervical region: Secondary | ICD-10-CM | POA: Diagnosis not present

## 2016-12-01 DIAGNOSIS — M542 Cervicalgia: Secondary | ICD-10-CM | POA: Diagnosis not present

## 2017-02-08 DIAGNOSIS — Z6827 Body mass index (BMI) 27.0-27.9, adult: Secondary | ICD-10-CM | POA: Diagnosis not present

## 2017-03-29 DIAGNOSIS — M5412 Radiculopathy, cervical region: Secondary | ICD-10-CM | POA: Diagnosis not present

## 2017-03-30 ENCOUNTER — Encounter: Payer: Self-pay | Admitting: Orthopaedic Surgery

## 2017-04-19 DIAGNOSIS — Z6828 Body mass index (BMI) 28.0-28.9, adult: Secondary | ICD-10-CM | POA: Diagnosis not present

## 2017-04-19 DIAGNOSIS — M5412 Radiculopathy, cervical region: Secondary | ICD-10-CM | POA: Diagnosis not present

## 2017-04-19 DIAGNOSIS — M542 Cervicalgia: Secondary | ICD-10-CM | POA: Diagnosis not present

## 2017-04-19 DIAGNOSIS — R030 Elevated blood-pressure reading, without diagnosis of hypertension: Secondary | ICD-10-CM | POA: Diagnosis not present

## 2017-04-27 DIAGNOSIS — M791 Myalgia: Secondary | ICD-10-CM | POA: Diagnosis not present

## 2017-05-17 ENCOUNTER — Ambulatory Visit (INDEPENDENT_AMBULATORY_CARE_PROVIDER_SITE_OTHER): Payer: BLUE CROSS/BLUE SHIELD | Admitting: Family Medicine

## 2017-05-17 ENCOUNTER — Encounter: Payer: Self-pay | Admitting: Family Medicine

## 2017-05-17 VITALS — BP 122/76 | Temp 98.0°F | Ht 71.0 in | Wt 183.0 lb

## 2017-05-17 DIAGNOSIS — J329 Chronic sinusitis, unspecified: Secondary | ICD-10-CM

## 2017-05-17 MED ORDER — AMOXICILLIN-POT CLAVULANATE 875-125 MG PO TABS
1.0000 | ORAL_TABLET | Freq: Two times a day (BID) | ORAL | 0 refills | Status: AC
Start: 2017-05-17 — End: 2017-05-31
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
## 2017-05-27 DIAGNOSIS — M791 Myalgia, unspecified site: Secondary | ICD-10-CM | POA: Diagnosis not present

## 2017-06-27 DIAGNOSIS — M791 Myalgia, unspecified site: Secondary | ICD-10-CM | POA: Diagnosis not present

## 2017-07-15 DIAGNOSIS — M5412 Radiculopathy, cervical region: Secondary | ICD-10-CM | POA: Diagnosis not present

## 2017-08-16 DIAGNOSIS — M542 Cervicalgia: Secondary | ICD-10-CM | POA: Diagnosis not present

## 2017-08-16 DIAGNOSIS — R030 Elevated blood-pressure reading, without diagnosis of hypertension: Secondary | ICD-10-CM | POA: Diagnosis not present

## 2017-08-16 DIAGNOSIS — M5412 Radiculopathy, cervical region: Secondary | ICD-10-CM | POA: Diagnosis not present

## 2017-08-16 DIAGNOSIS — Z6828 Body mass index (BMI) 28.0-28.9, adult: Secondary | ICD-10-CM | POA: Diagnosis not present

## 2017-09-05 DIAGNOSIS — J0100 Acute maxillary sinusitis, unspecified: Secondary | ICD-10-CM | POA: Diagnosis not present

## 2017-09-13 ENCOUNTER — Ambulatory Visit: Payer: BLUE CROSS/BLUE SHIELD | Admitting: Family Medicine

## 2017-09-13 ENCOUNTER — Encounter: Payer: Self-pay | Admitting: Family Medicine

## 2017-09-13 VITALS — BP 110/74 | Temp 98.4°F | Ht 71.0 in | Wt 191.0 lb

## 2017-09-13 DIAGNOSIS — J329 Chronic sinusitis, unspecified: Secondary | ICD-10-CM

## 2017-09-13 MED ORDER — AMOXICILLIN-POT CLAVULANATE 875-125 MG PO TABS: 1.0000 | ORAL_TABLET | Freq: Two times a day (BID) | ORAL | 0 refills | Status: AC

## 2017-09-13 NOTE — Progress Notes (Signed)
   Subjective:    Patient ID: Joseph Lozano, male    DOB: October 21, 1970, 47 y.o.   MRN: [id number]  Sinusitis  This is a new problem. Episode onset: one and a half weeks. Associated symptoms include congestion, coughing, headaches and a sore throat. (Diarrhea, wheezing) Treatments tried: nyquil, thera flu, zpack from urgent care.    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   Respiratory: Positive for cough.   Neurological: Positive for headaches.       Objective:   Physical Exam  Alert, mild malaise. Hydration good Vitals stable. frontal/ maxillary tenderness evident positive nasal congestion. pharynx normal neck supple  lungs clear/no crackles or wheezes. heart regular in rhythm       Assessment & Plan:  Impression rhinosinusitis likely post viral, discussed with patient. plan antibiotics prescribed. Questions answered. Symptomatic care discussed. warning signs discussed. WSL

## 2017-10-16 DIAGNOSIS — R091 Pleurisy: Secondary | ICD-10-CM | POA: Diagnosis not present

## 2017-10-16 DIAGNOSIS — R0781 Pleurodynia: Secondary | ICD-10-CM | POA: Diagnosis not present

## 2017-10-18 ENCOUNTER — Ambulatory Visit: Payer: BLUE CROSS/BLUE SHIELD | Admitting: Family Medicine

## 2017-11-03 ENCOUNTER — Ambulatory Visit: Payer: BLUE CROSS/BLUE SHIELD | Admitting: Family Medicine

## 2017-11-03 ENCOUNTER — Encounter: Payer: Self-pay | Admitting: Family Medicine

## 2017-11-03 VITALS — BP 112/80 | Ht 71.0 in | Wt 190.0 lb

## 2017-11-03 DIAGNOSIS — R109 Unspecified abdominal pain: Secondary | ICD-10-CM

## 2017-11-03 LAB — POCT URINALYSIS DIPSTICK
Blood, UA: NEGATIVE
LEUKOCYTES UA: NEGATIVE
SPEC GRAV UA: 1.025 (ref 1.010–1.025)
pH, UA: 6 (ref 5.0–8.0)

## 2017-11-03 NOTE — Progress Notes (Signed)
Subjective:    Patient ID: Joseph Lozano, male    DOB: 05/29/1971, 47 y.o.   MRN: [id number]  HPI Patient is here today to follow up on a recent hospital visit for right side pain.Was seen by an urgent care in [facility] for the pain and they thought it was pleurisy or maybe a pulled muscle. He was given Ibuprofen,hydrocodone.He states he still has some pain under the ribs,but more a gnawing feeling in lower right side.Has hx of Ibs.Not sure of intolerance to Ibuprofen.  The patient was seen in urgent care center [facility].  They thought that it could be pleuritic pain versus a pulled muscle.  They did do x-rays there I do not have these in front of me nor do I have a copy of the record.  Patient states they did not treat him for any pneumonia put him on ibuprofen He has ongoing right-sided abdominal pain and discomfort right upper quadrant discomfort denies nausea vomiting denies rectal bleeding no hematuria.  No flank pain.  No wheezing or difficulty breathing.  No night sweats or weight change no high fevers or change in appetite.  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  Review of Systems  Constitutional: Negative for activity change, fatigue and fever.  HENT: Negative for congestion and rhinorrhea.   Respiratory: Negative for cough and shortness of breath.   Cardiovascular: Negative for chest pain.  Gastrointestinal: Positive for abdominal pain. Negative for abdominal distention, blood in stool, diarrhea, nausea and vomiting.  Genitourinary: Positive for flank pain. Negative for dysuria and hematuria.  Musculoskeletal: Positive for back pain. Negative for arthralgias.  Neurological: Negative for weakness and headaches.  Psychiatric/Behavioral: Negative for confusion.       Objective:   Physical Exam  Constitutional: He appears well-nourished. No distress.    HENT:  Head: Normocephalic and atraumatic.  Eyes: Right eye exhibits no discharge. Left eye exhibits no discharge.  Neck: No tracheal deviation present.  Cardiovascular: Normal rate, regular rhythm and normal heart sounds.  No murmur heard. Pulmonary/Chest: Effort normal and breath sounds normal. No respiratory distress. He has no wheezes.  Musculoskeletal: He exhibits no edema.  Lymphadenopathy:    He has no cervical adenopathy.  Neurological: He is alert.  Skin: Skin is warm. No rash noted.  Psychiatric: His behavior is normal.  Vitals reviewed.  No masses felt.  Lungs were clear.  Patient does have some right mid abdominal tenderness no guarding or rebound.  Patient does have fullness on that side.  Results for orders placed or performed in visit on 11/03/17  POCT urinalysis dipstick  Result Value Ref Range   Color, UA     Clarity, UA     Glucose, UA     Bilirubin, UA     Ketones, UA     Spec Grav, UA 1.025 1.010 - 1.025   Blood, UA Negative    pH, UA 6.0 5.0 - 8.0   Protein, UA     Urobilinogen, UA  0.2 or 1.0 E.U./dL   Nitrite, UA     Leukocytes, UA Negative Negative   Appearance     Odor          Assessment & Plan:  Nonspecific right-sided abdominal pain present for over a week.  We will get the records from the urgent care center and x-ray report in addition to this  I would recommend ultrasound plus also recommend lab work.  May end up needing to have a CAT scan.  Patient will do stool blood testing we did discuss there is a possibility could be a growth or something very simple causing this and that it would be very necessary to see although this through with detailed testing 25 minutes was spent with the patient.  This statement verifies that 25 minutes was indeed spent with the patient. Greater than half the time was spent in discussion, counseling and answering questions  regarding the issues that the patient came in for today as reflected in the diagnosis (s)  please refer to documentation for further details.

## 2017-11-04 LAB — LIPID PANEL
CHOLESTEROL TOTAL: 197 mg/dL (ref 100–199)
Chol/HDL Ratio: 3.4 ratio (ref 0.0–5.0)
HDL: 58 mg/dL (ref >39)
LDL CALC: 125 mg/dL — AB (ref 0–99)
TRIGLYCERIDES: 72 mg/dL (ref 0–149)
VLDL CHOLESTEROL CAL: 14 mg/dL (ref 5–40)

## 2017-11-04 LAB — CBC WITH DIFFERENTIAL/PLATELET
BASOS ABS: 0 10*3/uL (ref 0.0–0.2)
Basos: 1 %
EOS (ABSOLUTE): 0.3 10*3/uL (ref 0.0–0.4)
Eos: 5 %
Hematocrit: 44.6 % (ref 37.5–51.0)
Hemoglobin: 15.2 g/dL (ref 13.0–17.7)
IMMATURE GRANS (ABS): 0 10*3/uL (ref 0.0–0.1)
Immature Granulocytes: 0 %
LYMPHS: 35 %
Lymphocytes Absolute: 2.2 10*3/uL (ref 0.7–3.1)
MCH: 27.4 pg (ref 26.6–33.0)
MCHC: 34.1 g/dL (ref 31.5–35.7)
MCV: 80 fL (ref 79–97)
Monocytes Absolute: 0.4 10*3/uL (ref 0.1–0.9)
Monocytes: 7 %
NEUTROS ABS: 3.3 10*3/uL (ref 1.4–7.0)
NEUTROS PCT: 52 %
PLATELETS: 239 10*3/uL (ref 150–379)
RBC: 5.55 x10E6/uL (ref 4.14–5.80)
RDW: 13.4 % (ref 12.3–15.4)
WBC: 6.2 10*3/uL (ref 3.4–10.8)

## 2017-11-04 LAB — BASIC METABOLIC PANEL
BUN / CREAT RATIO: 18 (ref 9–20)
BUN: 18 mg/dL (ref 6–24)
CO2: 24 mmol/L (ref 20–29)
CREATININE: 0.99 mg/dL (ref 0.76–1.27)
Calcium: 9.9 mg/dL (ref 8.7–10.2)
Chloride: 103 mmol/L (ref 96–106)
GFR, EST AFRICAN AMERICAN: 104 mL/min/{1.73_m2} (ref >59)
GFR, EST NON AFRICAN AMERICAN: 90 mL/min/{1.73_m2} (ref >59)
GLUCOSE: 101 mg/dL — AB (ref 65–99)
Potassium: 4.8 mmol/L (ref 3.5–5.2)
SODIUM: 141 mmol/L (ref 134–144)

## 2017-11-04 LAB — LIPASE: Lipase: 26 U/L (ref 13–78)

## 2017-11-04 LAB — HEPATIC FUNCTION PANEL
ALT: 29 IU/L (ref 0–44)
AST: 20 IU/L (ref 0–40)
Albumin: 4.7 g/dL (ref 3.5–5.5)
Alkaline Phosphatase: 68 IU/L (ref 39–117)
BILIRUBIN, DIRECT: 0.22 mg/dL (ref 0.00–0.40)
Bilirubin Total: 0.9 mg/dL (ref 0.0–1.2)
TOTAL PROTEIN: 7.6 g/dL (ref 6.0–8.5)

## 2017-11-05 ENCOUNTER — Encounter: Payer: Self-pay | Admitting: Family Medicine

## 2017-11-05 ENCOUNTER — Other Ambulatory Visit: Payer: Self-pay

## 2017-11-05 DIAGNOSIS — R109 Unspecified abdominal pain: Secondary | ICD-10-CM

## 2017-11-05 LAB — IFOBT (OCCULT BLOOD): IFOBT: NEGATIVE

## 2017-11-08 DIAGNOSIS — M5412 Radiculopathy, cervical region: Secondary | ICD-10-CM | POA: Diagnosis not present

## 2017-11-10 ENCOUNTER — Ambulatory Visit (HOSPITAL_COMMUNITY)
Admission: RE | Admit: 2017-11-10 | Discharge: 2017-11-10 | Disposition: A | Discharge status: Home / Self Care | Payer: BLUE CROSS/BLUE SHIELD | Source: Ambulatory Visit | Attending: Family Medicine | Admitting: Family Medicine

## 2017-11-10 DIAGNOSIS — R109 Unspecified abdominal pain: Secondary | ICD-10-CM | POA: Diagnosis not present

## 2017-11-10 DIAGNOSIS — R1011 Right upper quadrant pain: Secondary | ICD-10-CM | POA: Diagnosis not present

## 2017-11-11 ENCOUNTER — Other Ambulatory Visit: Payer: Self-pay | Admitting: Family Medicine

## 2017-11-11 DIAGNOSIS — R109 Unspecified abdominal pain: Secondary | ICD-10-CM

## 2017-11-19 ENCOUNTER — Encounter: Payer: Self-pay | Admitting: Family Medicine

## 2017-11-22 ENCOUNTER — Encounter: Payer: Self-pay | Admitting: Gastroenterology

## 2018-01-24 DIAGNOSIS — M5412 Radiculopathy, cervical region: Secondary | ICD-10-CM | POA: Diagnosis not present

## 2018-01-24 DIAGNOSIS — M542 Cervicalgia: Secondary | ICD-10-CM | POA: Diagnosis not present

## 2018-01-27 ENCOUNTER — Ambulatory Visit: Payer: BLUE CROSS/BLUE SHIELD | Admitting: Gastroenterology

## 2018-01-27 ENCOUNTER — Encounter: Payer: Self-pay | Admitting: Gastroenterology

## 2018-01-27 DIAGNOSIS — R109 Unspecified abdominal pain: Secondary | ICD-10-CM | POA: Diagnosis not present

## 2018-01-27 DIAGNOSIS — R1031 Right lower quadrant pain: Secondary | ICD-10-CM

## 2018-01-27 NOTE — Assessment & Plan Note (Signed)
Very pleasant 47 year old gentleman presenting with several month history of right mid abdominal pain.  Symptoms initially began along the right mid back radiating into the right lower rib cage.  Initially seen in urgent care, treated for possible pleurisy.  Abdominal ultrasound unremarkable.  Labs unremarkable.  Course of ibuprofen has not helped.  Notably patient has reproduction of symptoms with movement, unrelated to meals or bowel function.  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  Given chronicity of symptoms, would offer CT abdomen pelvis to further evaluate.  If unremarkable, symptoms likely musculoskeletal.  We also briefly discussed considering colonoscopy if insurance covers based on new [employer] guidelines for screening at age 17.  He will touch base with his insurance company and let me know if he wants to pursue.
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
## 2018-01-27 NOTE — Patient Instructions (Signed)
1. CT scan as planned. We will contact you with results as available.  2. Call your insurance company and ask if they allow "screening colonoscopy" at age 47 per [employer] guidelines.

## 2018-01-28 NOTE — Progress Notes (Signed)
cc'ed to pcp °

## 2018-01-28 NOTE — Patient Instructions (Signed)
PA for CT abd/pelvis w/contrast submitted via [employer]. Case approved. PA# [id number], valid 01/28/18-02/26/18.

## 2018-02-22 ENCOUNTER — Ambulatory Visit (HOSPITAL_COMMUNITY): Payer: BLUE CROSS/BLUE SHIELD

## 2018-02-28 ENCOUNTER — Telehealth: Payer: Self-pay | Admitting: Gastroenterology

## 2018-02-28 NOTE — Telephone Encounter (Signed)
Called pt, CT appt was 02/22/18. Gave him phone number to [employer].  Authorization has expired. Will notify insurance company after new appt has been scheduled.

## 2018-02-28 NOTE — Telephone Encounter (Signed)
[phone] PLEASE CALL PATIENT, HE THINKS HE GOT THE DAYS FOR A RADIOLOGY PROCEDURE WRONG AND HE NEEDS TO RESCHEDULED IT .

## 2018-02-28 NOTE — Telephone Encounter (Signed)
Pt rescheduled CT to 03/15/18. Called [employer], new request needs to be submitted.  New PA submitted via [employer]. Case approved. Order ID# [id number], valid 02/28/18-03/29/18.

## 2018-03-15 ENCOUNTER — Ambulatory Visit (HOSPITAL_COMMUNITY)
Admission: RE | Admit: 2018-03-15 | Discharge: 2018-03-15 | Disposition: A | Discharge status: Home / Self Care | Payer: BLUE CROSS/BLUE SHIELD | Source: Ambulatory Visit | Attending: Gastroenterology | Admitting: Gastroenterology

## 2018-03-15 DIAGNOSIS — R1031 Right lower quadrant pain: Secondary | ICD-10-CM | POA: Diagnosis not present

## 2018-03-15 DIAGNOSIS — R109 Unspecified abdominal pain: Secondary | ICD-10-CM | POA: Diagnosis not present

## 2018-03-15 MED ORDER — IOHEXOL 300 MG/ML  SOLN
100.0000 mL | Freq: Once | INTRAMUSCULAR | Status: AC | PRN
  Administered 2018-03-15: 100 mL via INTRAVENOUS

## 2018-03-21 DIAGNOSIS — M5412 Radiculopathy, cervical region: Secondary | ICD-10-CM | POA: Diagnosis not present

## 2018-03-21 NOTE — Progress Notes (Signed)
Pt is aware. Ok to schedule his colonoscopy. He said he would like to have it on a Monday or Tuesday if possible even  if it it had to be scheduled further out. Please check with him before scheduling.   Forwarding to [facility] to schedule.

## 2018-03-22 ENCOUNTER — Other Ambulatory Visit: Payer: Self-pay

## 2018-03-22 DIAGNOSIS — R109 Unspecified abdominal pain: Secondary | ICD-10-CM

## 2018-03-22 MED ORDER — PEG 3350-KCL-NA BICARB-NACL 420 G PO SOLR: 4000.0000 mL | ORAL | 0 refills | Status: DC

## 2018-05-10 DIAGNOSIS — M5412 Radiculopathy, cervical region: Secondary | ICD-10-CM | POA: Diagnosis not present

## 2018-05-10 DIAGNOSIS — R109 Unspecified abdominal pain: Secondary | ICD-10-CM | POA: Diagnosis not present

## 2018-05-10 DIAGNOSIS — R030 Elevated blood-pressure reading, without diagnosis of hypertension: Secondary | ICD-10-CM | POA: Diagnosis not present

## 2018-05-10 DIAGNOSIS — M542 Cervicalgia: Secondary | ICD-10-CM | POA: Diagnosis not present

## 2018-05-20 ENCOUNTER — Telehealth: Payer: Self-pay | Admitting: *Deleted

## 2018-05-20 NOTE — Telephone Encounter (Signed)
Hoyle Sauer in Endo called and wanted to see if patient can move procedure time on Monday 05/23/18 to 8:30am. I called patient, NA. I advised carolyn and told her I did not leave a message as the office was getting ready to close. She will make a note.

## 2018-05-23 ENCOUNTER — Encounter (HOSPITAL_COMMUNITY)
Admission: RE | Disposition: A | Discharge status: Home / Self Care | Payer: Self-pay | Source: Ambulatory Visit | Attending: Gastroenterology

## 2018-05-23 ENCOUNTER — Encounter (HOSPITAL_COMMUNITY): Payer: Self-pay | Admitting: *Deleted

## 2018-05-23 ENCOUNTER — Other Ambulatory Visit: Payer: Self-pay

## 2018-05-23 ENCOUNTER — Ambulatory Visit (HOSPITAL_COMMUNITY)
Admission: RE | Admit: 2018-05-23 | Discharge: 2018-05-23 | Disposition: A | Discharge status: Home / Self Care | Payer: BLUE CROSS/BLUE SHIELD | Source: Ambulatory Visit | Attending: Gastroenterology | Admitting: Gastroenterology

## 2018-05-23 DIAGNOSIS — Z8249 Family history of ischemic heart disease and other diseases of the circulatory system: Secondary | ICD-10-CM | POA: Insufficient documentation

## 2018-05-23 DIAGNOSIS — K589 Irritable bowel syndrome without diarrhea: Secondary | ICD-10-CM | POA: Insufficient documentation

## 2018-05-23 DIAGNOSIS — Z886 Allergy status to analgesic agent status: Secondary | ICD-10-CM | POA: Insufficient documentation

## 2018-05-23 DIAGNOSIS — D122 Benign neoplasm of ascending colon: Secondary | ICD-10-CM

## 2018-05-23 DIAGNOSIS — Z882 Allergy status to sulfonamides status: Secondary | ICD-10-CM | POA: Insufficient documentation

## 2018-05-23 DIAGNOSIS — K5730 Diverticulosis of large intestine without perforation or abscess without bleeding: Secondary | ICD-10-CM | POA: Insufficient documentation

## 2018-05-23 DIAGNOSIS — Q438 Other specified congenital malformations of intestine: Secondary | ICD-10-CM | POA: Insufficient documentation

## 2018-05-23 DIAGNOSIS — Z7982 Long term (current) use of aspirin: Secondary | ICD-10-CM | POA: Diagnosis not present

## 2018-05-23 DIAGNOSIS — R109 Unspecified abdominal pain: Secondary | ICD-10-CM

## 2018-05-23 DIAGNOSIS — Z87442 Personal history of urinary calculi: Secondary | ICD-10-CM | POA: Insufficient documentation

## 2018-05-23 DIAGNOSIS — Z803 Family history of malignant neoplasm of breast: Secondary | ICD-10-CM | POA: Insufficient documentation

## 2018-05-23 DIAGNOSIS — K648 Other hemorrhoids: Secondary | ICD-10-CM | POA: Insufficient documentation

## 2018-05-23 DIAGNOSIS — R1031 Right lower quadrant pain: Secondary | ICD-10-CM | POA: Diagnosis present

## 2018-05-23 HISTORY — PX: POLYPECTOMY: SHX5525

## 2018-05-23 HISTORY — PX: COLONOSCOPY: SHX5424

## 2018-05-23 SURGERY — COLONOSCOPY
Anesthesia: Moderate Sedation

## 2018-05-23 MED ORDER — MIDAZOLAM HCL 5 MG/5ML IJ SOLN
INTRAMUSCULAR | Status: DC | PRN
  Administered 2018-05-23: 2 mg via INTRAVENOUS

## 2018-05-23 MED ORDER — PROMETHAZINE HCL 25 MG/ML IJ SOLN
INTRAMUSCULAR | Status: AC
  Filled 2018-05-23: qty 1

## 2018-05-23 MED ORDER — PROMETHAZINE HCL 25 MG/ML IJ SOLN
INTRAMUSCULAR | Status: DC | PRN
  Administered 2018-05-23: 12.5 mg via INTRAVENOUS

## 2018-05-23 MED ORDER — SODIUM CHLORIDE 0.9% FLUSH
INTRAVENOUS | Status: AC
  Filled 2018-05-23: qty 10

## 2018-05-23 MED ORDER — MIDAZOLAM HCL 5 MG/5ML IJ SOLN
INTRAMUSCULAR | Status: AC
  Filled 2018-05-23: qty 10

## 2018-05-23 MED ORDER — MEPERIDINE HCL 100 MG/ML IJ SOLN
INTRAMUSCULAR | Status: DC | PRN
  Administered 2018-05-23: 50 mg via INTRAVENOUS

## 2018-05-23 MED ORDER — SODIUM CHLORIDE 0.9 % IV SOLN
INTRAVENOUS | Status: DC
  Administered 2018-05-23: 08:00:00 via INTRAVENOUS

## 2018-05-23 MED ORDER — MEPERIDINE HCL 100 MG/ML IJ SOLN
INTRAMUSCULAR | Status: AC
  Filled 2018-05-23: qty 2

## 2018-05-23 NOTE — Op Note (Signed)
[facility] Patient Name: Joseph Lozano Procedure Date: 05/23/2018 8:09 AM MRN: [id number] Date of Birth: 03-13-1971 Attending MD: Barney Drain MD, MD CSN: [id number] Age: 47 Admit Type: Outpatient Procedure:                Colonoscopy WITH COLD SNARE POLYPECTOMY Indications:              Abdominal pain in the right lower quadrant Providers:                Barney Drain MD, MD, Rosina Lowenstein, RN, Aram Candela Referring MD:             Rosemary Holms, MD Medicines:                Promethazine 12.5 mg IV, Meperidine 50 mg IV,                            Midazolam 2 mg IV Complications:            No immediate complications. Estimated Blood Loss:     Estimated blood loss was minimal. Procedure:                Pre-Anesthesia Assessment:                           - Prior to the procedure, a History and Physical                            was performed, and patient medications and                            allergies were reviewed. The patient's tolerance of                            previous anesthesia was also reviewed. The risks                            and benefits of the procedure and the sedation                            options and risks were discussed with the patient.                            All questions were answered, and informed consent                            was obtained. Prior Anticoagulants: The patient                            last took ibuprofen 8 days prior to the procedure.                            ASA Grade Assessment: II - A patient with mild                            systemic disease. After reviewing the risks and  benefits, the patient was deemed in satisfactory                            condition to undergo the procedure. After obtaining                            informed consent, the colonoscope was passed under                            direct vision. Throughout the procedure, the   patient's blood pressure, pulse, and oxygen                            saturations were monitored continuously. The                            CF-[id number] ([id number]) scope was introduced through                            the anus and advanced to the 5 cm into the ileum.                            The colonoscopy was somewhat difficult due to a                            tortuous colon. Successful completion of the                            procedure was aided by straightening and shortening                            the scope to obtain bowel loop reduction and                            COLOWRAP. The patient tolerated the procedure well.                            The quality of the bowel preparation was good. The                            terminal ileum, ileocecal valve, appendiceal                            orifice, and rectum were photographed. Scope In: 8:54:07 AM Scope Out: 9:14:20 AM Scope Withdrawal Time: 0 hours 18 minutes 9 seconds  Total Procedure Duration: 0 hours 20 minutes 13 seconds  Findings:      The terminal ileum appeared normal.      A 4 mm polyp was found in the mid ascending colon. The polyp was       sessile. The polyp was removed with a cold snare. Resection and       retrieval were complete.      A few small-mouthed diverticula were found in the recto-sigmoid colon       and sigmoid colon.  Internal hemorrhoids were found. The hemorrhoids were moderate.      The recto-sigmoid colon and sigmoid colon were mildly redundant. Impression:               - The examined portion of the ileum was normal.                           - One 4 mm polyp in the mid ascending colon,                            removed with a cold snare. Resected and retrieved.                           - MILD Diverticulosis in the recto-sigmoid colon                            and in the sigmoid colon.                           - MODERATE Internal hemorrhoids.                           -  Redundant LEFT colon.                           - RLQ PAIN MOST LIKELY DUE TO ABDOMINAL WALL PAIN Moderate Sedation:      Moderate (conscious) sedation was administered by the endoscopy nurse       and supervised by the endoscopist. The following parameters were       monitored: oxygen saturation, heart rate, blood pressure, and response       to care. Total physician intraservice time was 31 minutes. Recommendation:           - Patient has a contact number available for                            emergencies. The signs and symptoms of potential                            delayed complications were discussed with the                            patient. Return to normal activities tomorrow.                            Written discharge instructions were provided to the                            patient.                           - High fiber diet.                           - Continue present medications.                           -  Await pathology results.                           - Repeat colonoscopy in 5-10 years for surveillance. Procedure Code(s):        --- Professional ---                           [phone], Colonoscopy, flexible; with removal of                            tumor(s), polyp(s), or other lesion(s) by snare                            technique                           99153, Moderate sedation; each additional 15                            minutes intraservice time                           G0500, Moderate sedation services provided by the                            same physician or other qualified health care                            professional performing a gastrointestinal                            endoscopic service that sedation supports,                            requiring the presence of an independent trained                            observer to assist in the monitoring of the                            patient's level of consciousness and physiological                             status; initial 15 minutes of intra-service time;                            patient age 15 years or older (additional time may                            be reported with [id number], as appropriate) Diagnosis Code(s):        --- Professional ---                           K64.8, Other hemorrhoids  D12.2, Benign neoplasm of ascending colon                           R10.31, Right lower quadrant pain                           K57.30, Diverticulosis of large intestine without                            perforation or abscess without bleeding                           Q43.8, Other specified congenital malformations of                            intestine CPT copyright 2018 [employer] Association. All rights reserved. The codes documented in this report are preliminary and upon coder review may  be revised to meet current compliance requirements. Barney Drain, MD Barney Drain MD, MD 05/23/2018 9:28:36 AM This report has been signed electronically. Number of Addenda: 0

## 2018-05-23 NOTE — H&P (Signed)
Primary Care Physician:  [name], MD Primary Gastroenterologist:  Dr. Oneida Alar  Pre-Procedure History & Physical: HPI:  Joseph Lozano is a 47 y.o. male here for ABDOMINAL PAIN.  Past Medical History:  Diagnosis Date  . Fractures   . IBS (irritable bowel syndrome)   . Kidney stone   . Pneumonia     Past Surgical History:  Procedure Laterality Date  . COLONOSCOPY  2009   Dr. Oneida Alar: Normal  . TONSILLECTOMY      Prior to Admission medications   Medication Sig Start Date End Date Taking? Authorizing Provider  ibuprofen (ADVIL,MOTRIN) 200 MG tablet Take 400 mg by mouth daily as needed for headache.   Yes [provider]  polyethylene glycol-electrolytes (TRILYTE) 420 g solution Take 4,000 mLs by mouth as directed. 03/22/18  Yes Fields, Sandi L, MD  tiZANidine (ZANAFLEX) 4 MG tablet Take 4 mg by mouth every 8 (eight) hours as needed for muscle spasms.   Yes [provider]    Allergies as of 03/22/2018 - Review Complete 01/27/2018  Allergen Reaction Noted  . Aspirin  04/19/2009  . Nsaids  10/28/2012  . Sulfonamide derivatives  04/19/2009    Family History  Problem Relation Age of Onset  . Cancer Mother        Breast, lung  . Hypertension Maternal Grandmother   . Heart attack Maternal Grandmother   . Colon cancer Neg Hx     Social History   Socioeconomic History  . Marital status: Married    Spouse name: Not on file  . Number of children: Not on file  . Years of education: Not on file  . Highest education level: Not on file  Occupational History  . Not on file  Social Needs  . Financial resource strain: Not on file  . Food insecurity:    Worry: Not on file    Inability: Not on file  . Transportation needs:    Medical: Not on file    Non-medical: Not on file  Tobacco Use  . Smoking status: Never Smoker  . Smokeless tobacco: Never Used  Substance and Sexual Activity  . Alcohol use: No  . Drug use: No  . Sexual activity: Not on  file  Lifestyle  . Physical activity:    Days per week: Not on file    Minutes per session: Not on file  . Stress: Not on file  Relationships  . Social connections:    Talks on phone: Not on file    Gets together: Not on file    Attends religious service: Not on file    Active member of club or organization: Not on file    Attends meetings of clubs or organizations: Not on file    Relationship status: Not on file  . Intimate partner violence:    Fear of current or ex partner: Not on file    Emotionally abused: Not on file    Physically abused: Not on file    Forced sexual activity: Not on file  Other Topics Concern  . Not on file  Social History Narrative  . Not on file    Review of Systems: See HPI, otherwise negative ROS   Physical Exam: BP 128/87   Pulse 65   Temp (!) 97.4 F (36.3 C) (Oral)   Resp 12   Ht 5\' 11"  (1.803 m)   Wt 83.5 kg   SpO2 100%   BMI 25.66 kg/m  General:   Alert,  pleasant and cooperative in NAD Head:  Normocephalic and atraumatic. Neck:  Supple; Lungs:  Clear throughout to auscultation.    Heart:  Regular rate and rhythm. Abdomen:  Soft, nontender and nondistended. Normal bowel sounds, without guarding, and without rebound.   Neurologic:  Alert and  oriented x4;  grossly normal neurologically.  Impression/Plan:     ABDOMINAL PAIN  PLAN: 1. TCS TODAY. DISCUSSED PROCEDURE, BENEFITS, & RISKS: < 1% chance of medication reaction, bleeding, perforation, or rupture of spleen/liver.

## 2018-05-23 NOTE — Discharge Instructions (Signed)
You have small internal hemorrhoids and diverticulosis IN YOUR LEFT COLON. YOU HAD ONE SMALL POLYP REMOVED.    DRINK WATER TO KEEP YOUR URINE LIGHT YELLOW.  FOLLOW A HIGH FIBER DIET. AVOID ITEMS THAT CAUSE BLOATING. See info below.  YOUR BIOPSY RESULTS WILL BE BACK IN 5 BUSINESS DAYS.  USE PREPARATION H FOUR TIMES  A DAY IF NEEDED TO RELIEVE RECTAL PAIN/PRESSURE/BLEEDING.  Next colonoscopy between 2022 and 2024.  Colonoscopy Care After Read the instructions outlined below and refer to this sheet in the next week. These discharge instructions provide you with general information on caring for yourself after you leave the hospital. While your treatment has been planned according to the most current medical practices available, unavoidable complications occasionally occur. If you have any problems or questions after discharge, call DR. Rodolphe Edmonston, [phone].  ACTIVITY  You may resume your regular activity, but move at a slower pace for the next 24 hours.   Take frequent rest periods for the next 24 hours.   Walking will help get rid of the air and reduce the bloated feeling in your belly (abdomen).   No driving for 24 hours (because of the medicine (anesthesia) used during the test).   You may shower.   Do not sign any important legal documents or operate any machinery for 24 hours (because of the anesthesia used during the test).    NUTRITION  Drink plenty of fluids.   You may resume your normal diet as instructed by your doctor.   Begin with a light meal and progress to your normal diet. Heavy or fried foods are harder to digest and may make you feel sick to your stomach (nauseated).   Avoid alcoholic beverages for 24 hours or as instructed.    MEDICATIONS  You may resume your normal medications.   WHAT YOU CAN EXPECT TODAY  Some feelings of bloating in the abdomen.   Passage of more gas than usual.   Spotting of blood in your stool or on the toilet paper  .  IF  YOU HAD POLYPS REMOVED DURING THE COLONOSCOPY:  Eat a soft diet IF YOU HAVE NAUSEA, BLOATING, ABDOMINAL PAIN, OR VOMITING.    FINDING OUT THE RESULTS OF YOUR TEST Not all test results are available during your visit. DR. Oneida Alar WILL CALL YOU WITHIN 14 DAYS OF YOUR PROCEDUE WITH YOUR RESULTS. Do not assume everything is normal if you have not heard from DR. Jencarlo Bonadonna, CALL HER OFFICE AT [phone].  SEEK IMMEDIATE MEDICAL ATTENTION AND CALL THE OFFICE: [phone] IF:  You have more than a spotting of blood in your stool.   Your belly is swollen (abdominal distention).   You are nauseated or vomiting.   You have a temperature over 101F.   You have abdominal pain or discomfort that is severe or gets worse throughout the day.  High-Fiber Diet A high-fiber diet changes your normal diet to include more whole grains, legumes, fruits, and vegetables. Changes in the diet involve replacing refined carbohydrates with unrefined foods. The calorie level of the diet is essentially unchanged. The Dietary Reference Intake (recommended amount) for adult males is 38 grams per day. For adult females, it is 25 grams per day. Pregnant and lactating women should consume 28 grams of fiber per day. Fiber is the intact part of a plant that is not broken down during digestion. Functional fiber is fiber that has been isolated from the plant to provide a beneficial effect in the body. PURPOSE  Increase stool  bulk.   Ease and regulate bowel movements.   Lower cholesterol.   REDUCE RISK OF COLON CANCER  INDICATIONS THAT YOU NEED MORE FIBER  Constipation and hemorrhoids.   Uncomplicated diverticulosis (intestine condition) and irritable bowel syndrome.   Weight management.   As a protective measure against hardening of the arteries (atherosclerosis), diabetes, and cancer.   GUIDELINES FOR INCREASING FIBER IN THE DIET  Start adding fiber to the diet slowly. A gradual increase of about 5 more grams (2  slices of whole-wheat bread, 2 servings of most fruits or vegetables, or 1 bowl of high-fiber cereal) per day is best. Too rapid an increase in fiber may result in constipation, flatulence, and bloating.   Drink enough water and fluids to keep your urine clear or pale yellow. Water, juice, or caffeine-free drinks are recommended. Not drinking enough fluid may cause constipation.   Eat a variety of high-fiber foods rather than one type of fiber.   Try to increase your intake of fiber through using high-fiber foods rather than fiber pills or supplements that contain small amounts of fiber.   The goal is to change the types of food eaten. Do not supplement your present diet with high-fiber foods, but replace foods in your present diet.   INCLUDE A VARIETY OF FIBER SOURCES  Replace refined and processed grains with whole grains, canned fruits with fresh fruits, and incorporate other fiber sources. White rice, white breads, and most bakery goods contain little or no fiber.   Brown whole-grain rice, buckwheat oats, and many fruits and vegetables are all good sources of fiber. These include: broccoli, Brussels sprouts, cabbage, cauliflower, beets, sweet potatoes, white potatoes (skin on), carrots, tomatoes, eggplant, squash, berries, fresh fruits, and dried fruits.   Cereals appear to be the richest source of fiber. Cereal fiber is found in whole grains and bran. Bran is the fiber-rich outer coat of cereal grain, which is largely removed in refining. In whole-grain cereals, the bran remains. In breakfast cereals, the largest amount of fiber is found in those with "bran" in their names. The fiber content is sometimes indicated on the label.   You may need to include additional fruits and vegetables each day.   In baking, for 1 cup white flour, you may use the following substitutions:   1 cup whole-wheat flour minus 2 tablespoons.   1/2 cup white flour plus 1/2 cup whole-wheat flour.   Polyps, Colon   A polyp is extra tissue that grows inside your body. Colon polyps grow in the large intestine. The large intestine, also called the colon, is part of your digestive system. It is a long, hollow tube at the end of your digestive tract where your body makes and stores stool. Most polyps are not dangerous. They are benign. This means they are not cancerous. But over time, some types of polyps can turn into cancer. Polyps that are smaller than a pea are usually not harmful. But larger polyps could someday become or may already be cancerous. To be safe, doctors remove all polyps and test them.   PREVENTION There is not one sure way to prevent polyps. You might be able to lower your risk of getting them if you:  Eat more fruits and vegetables and less fatty food.   Do not smoke.   Avoid alcohol.   Exercise every day.   Lose weight if you are overweight.   Eating more calcium and folate can also lower your risk of getting polyps. Some foods  that are rich in calcium are milk, cheese, and broccoli. Some foods that are rich in folate are chickpeas, kidney beans, and spinach.    Diverticulosis Diverticulosis is a common condition that develops when small pouches (diverticula) form in the wall of the colon. The risk of diverticulosis increases with age. It happens more often in people who eat a low-fiber diet. Most individuals with diverticulosis have no symptoms. Those individuals with symptoms usually experience belly (abdominal) pain, constipation, or loose stools (diarrhea).  HOME CARE INSTRUCTIONS  Increase the amount of fiber in your diet as directed by your caregiver or dietician. This may reduce symptoms of diverticulosis.   Drink at least 6 to 8 glasses of water each day to prevent constipation.   Try not to strain when you have a bowel movement.   Avoiding nuts and seeds to prevent complications is NOT NECESSARY.   FOODS HAVING HIGH FIBER CONTENT INCLUDE:  Fruits. Apple, peach,  pear, tangerine, raisins, prunes.   Vegetables. Brussels sprouts, asparagus, broccoli, cabbage, carrot, cauliflower, romaine lettuce, spinach, summer squash, tomato, winter squash, zucchini.   Starchy Vegetables. Baked beans, kidney beans, lima beans, split peas, lentils, potatoes (with skin).   Grains. Whole wheat bread, brown rice, bran flake cereal, plain oatmeal, white rice, shredded wheat, bran muffins.   SEEK IMMEDIATE MEDICAL CARE IF:  You develop increasing pain or severe bloating.   You have an oral temperature above 101F.   You develop vomiting or bowel movements that are bloody or black.

## 2018-05-25 ENCOUNTER — Telehealth: Payer: Self-pay | Admitting: Gastroenterology

## 2018-05-25 ENCOUNTER — Telehealth: Payer: Self-pay | Admitting: *Deleted

## 2018-05-25 ENCOUNTER — Telehealth: Payer: Self-pay | Admitting: Family Medicine

## 2018-05-25 NOTE — Telephone Encounter (Signed)
PLEASE CALL PT. He can use MOM PILLS OR LIQUID DAILY TO PREVENT CONSTIPATION.

## 2018-05-25 NOTE — Telephone Encounter (Signed)
PLEASE CALL PT. It sounds like IS MOST LIKELY RELATED TO CONSTIPATION.

## 2018-05-25 NOTE — Telephone Encounter (Signed)
Patient called. Patient states when he was prepping for his TCS, he noticed his right sided pain went away. He had TCS on Monday and he was fine until he ate dinner. Right side pain came back. He decided to take 2 dulcolax. He used the bathroom and pain went away. He also stated he noticed the right side pain started when he was bit by a tick in June. He wants to know if this could be related? He states he spoke with Dr. Wolfgang Phoenix and was advised to call us. Please advise Dr. Oneida Alar. Thanks

## 2018-05-25 NOTE — Telephone Encounter (Signed)
LMOM to call.

## 2018-05-25 NOTE — Telephone Encounter (Signed)
Please call pt. HE had ONE simple adenoma removed.   DRINK WATER TO KEEP YOUR URINE LIGHT YELLOW.  FOLLOW A HIGH FIBER DIET. AVOID ITEMS THAT CAUSE BLOATING.  USE PREPARATION H FOUR TIMES  A DAY IF NEEDED TO RELIEVE RECTAL PAIN/PRESSURE/BLEEDING.  Next colonoscopy between 2022 and 2024.

## 2018-05-25 NOTE — Telephone Encounter (Signed)
colonoscopy Monday, (R) side pain Sunday after passing BM pain went away, pt stated he had gotten a tick bite and was wondering if his stomach could possibly hurt from the tick bite because pt ate red meat that night for supper. Advise.

## 2018-05-25 NOTE — Telephone Encounter (Signed)
Patient states he had called [facility] and they told him to call here. He states he thinks this is related to a tick bite he had in June. He states just about every time he eats red meat he gets the pain.He has no fever,no nausea or vomiting, bleeding. I offered pt to come in today to be evaluated and he states he can not come in today, offered him tomorrow he states he will be out of town from tomorrow until next week. He was instructed if he developed worsening symptoms to go to the urgent care or the ed. I then transferred him up front to set up an appt that was convenient to his schedule.

## 2018-05-25 NOTE — Telephone Encounter (Signed)
ok 

## 2018-05-25 NOTE — Telephone Encounter (Signed)
PT is aware.

## 2018-05-25 NOTE — Telephone Encounter (Signed)
Patient aware.

## 2018-05-25 NOTE — Telephone Encounter (Signed)
Patient is aware. He wants to know what he should do for the constipation?

## 2018-05-27 ENCOUNTER — Encounter (HOSPITAL_COMMUNITY): Payer: Self-pay | Admitting: Gastroenterology

## 2018-05-27 NOTE — Telephone Encounter (Signed)
Reminder in epic °

## 2018-05-31 ENCOUNTER — Encounter: Payer: Self-pay | Admitting: Family Medicine

## 2018-05-31 ENCOUNTER — Ambulatory Visit: Payer: BLUE CROSS/BLUE SHIELD | Admitting: Family Medicine

## 2018-05-31 VITALS — BP 118/80 | Temp 98.0°F | Ht 71.0 in | Wt 190.0 lb

## 2018-05-31 DIAGNOSIS — S30861A Insect bite (nonvenomous) of abdominal wall, initial encounter: Secondary | ICD-10-CM

## 2018-05-31 DIAGNOSIS — R109 Unspecified abdominal pain: Secondary | ICD-10-CM

## 2018-05-31 DIAGNOSIS — W57XXXA Bitten or stung by nonvenomous insect and other nonvenomous arthropods, initial encounter: Secondary | ICD-10-CM

## 2018-05-31 NOTE — Progress Notes (Signed)
   Subjective:    Patient ID: Joseph Lozano, male    DOB: [birth year], 47 y.o.   MRN: [id number]  HPI  Patient is here today with complaints of right side pain   . After a tick bite in June, He also has some diarrhea,runny nose,headach   e. He has been taking ibuprofen. He states he has had an u/s,ct scan, and a colonoscopy. He thinks he has a red meat allergy after the tick bite.  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   Pt noted some improvement with a pep for colonoscopy   Then ate tacos with ground beef seemed to act up more after that   Then ate tacos and leftovers and got the same pain    Pt noted pain eased off after  Using the dulcolax   Pt had a tick bit e with residual smal bump at site of bite from months ao   When at its worse, the pain gets pretty bad  When he reaches back to wife notes flare of the pain with this motion   Diet not the best    Review of Systems No headache, no major weight loss or weight gain, no chest pain no back pain abdominal pain no change in bowel habits complete ROS otherwise negative     Objective:   Physical Exam Alert and oriented, vitals reviewed and stable, NAD ENT-TM's and ext canals WNL bilat via otoscopic exam Soft palate, tonsils and post pharynx WNL via oropharyngeal exam Neck-symmetric, no masses; thyroid nonpalpable and nontender Pulmonary-no tachypnea or accessory muscle use; Clear without wheezes via auscultation Card--no abnrml murmurs, rhythm reg and rate WNL Carotid pulses symmetric, without bruits          Assessment & Plan:  Impression intermittent right lateral costal margin/right upper quadrant/lateral chest pain.  At times fairly severe.  Sometimes after meals with red meat.  Patient concerned about alpha gal.  Has had a rather felt GI work-up.  Thorough.  Discussed.  Has had gallbladder ultrasound.  And also: Exam.  Has not had a HIDA scan.  No rashes.  No headache no muscle and joint difficulties.  We will press on with alpha gal.  If negative and continues to have bouts of pain following meat meals may need to consider HIDA scan rationale discussed  Greater than 50% of this 25 minute face to face visit was spent in counseling and discussion and coordination of care regarding the above diagnosis/diagnosies

## 2018-06-03 LAB — ALPHA-GAL PANEL
Alpha Gal IgE*: <0.1 kU/L (ref <0.10)
BEEF CLASS INTERPRETATION: 0
Beef (Bos spp) IgE: <0.1 kU/L (ref <0.35)
Class Interpretation: 0
Class Interpretation: 0
Pork (Sus spp) IgE: <0.1 kU/L (ref <0.35)

## 2018-08-09 DIAGNOSIS — M5412 Radiculopathy, cervical region: Secondary | ICD-10-CM | POA: Diagnosis not present

## 2018-08-11 DIAGNOSIS — D225 Melanocytic nevi of trunk: Secondary | ICD-10-CM | POA: Diagnosis not present

## 2018-08-11 DIAGNOSIS — L820 Inflamed seborrheic keratosis: Secondary | ICD-10-CM | POA: Diagnosis not present

## 2018-08-11 DIAGNOSIS — B078 Other viral warts: Secondary | ICD-10-CM | POA: Diagnosis not present

## 2018-10-04 DIAGNOSIS — M5412 Radiculopathy, cervical region: Secondary | ICD-10-CM | POA: Diagnosis not present

## 2018-10-04 DIAGNOSIS — M4802 Spinal stenosis, cervical region: Secondary | ICD-10-CM | POA: Diagnosis not present

## 2018-10-04 DIAGNOSIS — Z6829 Body mass index (BMI) 29.0-29.9, adult: Secondary | ICD-10-CM | POA: Diagnosis not present

## 2018-10-04 DIAGNOSIS — R030 Elevated blood-pressure reading, without diagnosis of hypertension: Secondary | ICD-10-CM | POA: Diagnosis not present

## 2018-10-11 ENCOUNTER — Encounter: Payer: Self-pay | Admitting: Family Medicine

## 2018-10-11 ENCOUNTER — Ambulatory Visit: Payer: BLUE CROSS/BLUE SHIELD | Admitting: Family Medicine

## 2018-10-11 VITALS — BP 122/88 | Temp 98.6°F | Ht 71.0 in | Wt 194.0 lb

## 2018-10-11 DIAGNOSIS — J310 Chronic rhinitis: Secondary | ICD-10-CM

## 2018-10-11 DIAGNOSIS — J329 Chronic sinusitis, unspecified: Secondary | ICD-10-CM

## 2018-10-11 MED ORDER — AMOXICILLIN-POT CLAVULANATE 875-125 MG PO TABS: ORAL_TABLET | ORAL | 0 refills | Status: DC

## 2018-10-11 MED ORDER — FLUTICASONE PROPIONATE 50 MCG/ACT NA SUSP: 2.0000 | Freq: Every day | NASAL | 6 refills | Status: DC

## 2018-10-11 NOTE — Progress Notes (Signed)
   Subjective:    Patient ID: Joseph Lozano, male    DOB: 26-Aug-1970, 48 y.o.   MRN: [id number]  Sinusitis  This is a new problem. Episode onset: one month. Associated symptoms include congestion, coughing, headaches and a sore throat. Treatments tried: tylenol severe sinus, nyquil, halls.   Started about a month ago cough and cong and dranae   Sore throat and sinus es   Some ear pressure   h a frontla , worse with cough, sharp at times, achey at times   Nasal disch green and gunky       Review of Systems  HENT: Positive for congestion and sore throat.   Respiratory: Positive for cough.   Neurological: Positive for headaches.       Objective:   Physical Exam  Alert, mild malaise. Hydration good Vitals stable. frontal/ maxillary tenderness evident positive nasal congestion. pharynx normal neck supple  lungs clear/no crackles or wheezes. heart regular in rhythm     Assessment & Plan:  Impression rhinosinusitis likely post viral, discussed with patient. plan antibiotics prescribed. Questions answered. Symptomatic care discussed. warning signs discussed. WSL Subacute in nature therefore will stretch out to 14 days utilize Augmentin and cover with Flonase in addition

## 2019-03-22 DIAGNOSIS — M5412 Radiculopathy, cervical region: Secondary | ICD-10-CM | POA: Diagnosis not present

## 2019-04-19 DIAGNOSIS — M5412 Radiculopathy, cervical region: Secondary | ICD-10-CM | POA: Diagnosis not present

## 2019-04-19 DIAGNOSIS — M4802 Spinal stenosis, cervical region: Secondary | ICD-10-CM | POA: Diagnosis not present

## 2019-08-30 ENCOUNTER — Ambulatory Visit (INDEPENDENT_AMBULATORY_CARE_PROVIDER_SITE_OTHER): Payer: BC Managed Care – PPO | Admitting: Family Medicine

## 2019-08-30 ENCOUNTER — Other Ambulatory Visit: Payer: Self-pay

## 2019-08-30 DIAGNOSIS — K5792 Diverticulitis of intestine, part unspecified, without perforation or abscess without bleeding: Secondary | ICD-10-CM

## 2019-08-30 MED ORDER — HYOSCYAMINE SULFATE SL 0.125 MG SL SUBL: SUBLINGUAL_TABLET | SUBLINGUAL | 0 refills | Status: DC

## 2019-08-30 MED ORDER — METRONIDAZOLE 500 MG PO TABS: 500.0000 mg | ORAL_TABLET | Freq: Three times a day (TID) | ORAL | 0 refills | Status: DC

## 2019-08-30 MED ORDER — CIPROFLOXACIN HCL 500 MG PO TABS: 500.0000 mg | ORAL_TABLET | Freq: Two times a day (BID) | ORAL | 0 refills | Status: DC

## 2019-08-30 NOTE — Progress Notes (Signed)
   Subjective:    Patient ID: Joseph Lozano, male    DOB: Dec 30, 1970, 49 y.o.   MRN: [id number]  HPItenderness on left lower side and some diarrhea. Started 3 days ago. Takes probiotic every day and has double up on them since symptoms started. Worse when sitting. Feels better when lying down.   States about one year ago he had pneumonia and it took awhile to get over it but has noticed since then he has had sob.   Virtual Visit via Telephone Note  I connected with Joseph Lozano on 08/30/19 at  8:30 AM EST by telephone and verified that I am speaking with the correct person using two identifiers.  Location: Patient: home Provider: office   I discussed the limitations, risks, security and privacy concerns of performing an evaluation and management service by telephone and the availability of in person appointments. I also discussed with the patient that there may be a patient responsible charge related to this service. The patient expressed understanding and agreed to proceed.   History of Present Illness:    Observations/Objective:   Assessment and Plan:   Follow Up Instructions:    I discussed the assessment and treatment plan with the patient. The patient was provided an opportunity to ask questions and all were answered. The patient agreed with the plan and demonstrated an understanding of the instructions.   The patient was advised to call back or seek an in-person evaluation if the symptoms worsen or if the condition fails to improve as anticipated.  I provided 22 minutes of non-face-to-face time during this encounter.  Patient does note tenderness when he pushes into the left lower quadrant of the abdomen.  2 to 3 days duration now.  At times pain fairly significant    Review of Systems No fever no chills no vomiting    Objective:   Physical Exam   Virtual     Assessment & Plan:  Impression left lower quadrant pain and tenderness with loose stools.  Review  of prior records reveals diverticulosis on colonoscopy 2 years ago.  This could well represent diverticulitis.  Discussed and explained.  Questions answered.  Questions regarding diet and long-term avoidance discussed.  Cipro and Flagyl prescribed appropriate dose warning signs discussed  Patient encouraged to get in the spring for physical and discussion of other chronic challenges

## 2019-08-31 ENCOUNTER — Telehealth: Payer: Self-pay | Admitting: Family Medicine

## 2019-08-31 ENCOUNTER — Encounter: Payer: Self-pay | Admitting: Family Medicine

## 2019-08-31 NOTE — Telephone Encounter (Signed)
Please give work note for patient. Thank you

## 2019-08-31 NOTE — Telephone Encounter (Signed)
Patient is requesting work excuse for 1/27-1/29. He was seen 1/27. Please advise

## 2019-08-31 NOTE — Telephone Encounter (Signed)
Work note upfront for pick up

## 2019-08-31 NOTE — Telephone Encounter (Signed)
Please advise. Thank you

## 2019-08-31 NOTE — Telephone Encounter (Signed)
sure

## 2019-09-06 ENCOUNTER — Encounter: Payer: Self-pay | Admitting: Family Medicine

## 2019-11-08 DIAGNOSIS — D485 Neoplasm of uncertain behavior of skin: Secondary | ICD-10-CM | POA: Diagnosis not present

## 2019-11-08 DIAGNOSIS — D225 Melanocytic nevi of trunk: Secondary | ICD-10-CM | POA: Diagnosis not present

## 2019-11-08 DIAGNOSIS — D2262 Melanocytic nevi of left upper limb, including shoulder: Secondary | ICD-10-CM | POA: Diagnosis not present

## 2019-11-08 DIAGNOSIS — L905 Scar conditions and fibrosis of skin: Secondary | ICD-10-CM | POA: Diagnosis not present

## 2019-12-07 ENCOUNTER — Telehealth: Payer: Self-pay | Admitting: *Deleted

## 2019-12-07 ENCOUNTER — Other Ambulatory Visit: Payer: Self-pay

## 2019-12-07 ENCOUNTER — Telehealth (INDEPENDENT_AMBULATORY_CARE_PROVIDER_SITE_OTHER): Payer: BC Managed Care – PPO | Admitting: Family Medicine

## 2019-12-07 DIAGNOSIS — J310 Chronic rhinitis: Secondary | ICD-10-CM

## 2019-12-07 DIAGNOSIS — J329 Chronic sinusitis, unspecified: Secondary | ICD-10-CM | POA: Diagnosis not present

## 2019-12-07 MED ORDER — AMOXICILLIN-POT CLAVULANATE 875-125 MG PO TABS: 1.0000 | ORAL_TABLET | Freq: Two times a day (BID) | ORAL | 0 refills | Status: AC

## 2019-12-07 MED ORDER — ALBUTEROL SULFATE HFA 108 (90 BASE) MCG/ACT IN AERS: 2.0000 | INHALATION_SPRAY | Freq: Four times a day (QID) | RESPIRATORY_TRACT | 0 refills | Status: DC | PRN

## 2019-12-07 NOTE — Progress Notes (Signed)
   Subjective:  Audio only  Patient ID: Joseph Lozano, male    DOB: 31-Aug-1970, 49 y.o.   MRN: [id number]  Cough This is a new problem. Associated symptoms include nasal congestion. Associated symptoms comments: Sinus issues.   Started a couple weeks back  Tearing down a deck  Got outdoor allergy symptoms   Sore throat and chest congest  No fever  Has noted persistent frontal nasal congestion with frontal headache also No known    Review of Systems  Respiratory: Positive for cough.        Objective:   Physical Exam    Virtual    Assessment & Plan:  Impression rhinosinusitis/bronchitis.  2 weeks duration.,  Though worse recently.  Antibiotics prescribed.  COVID-19 potential and considerations discussed.  Testing encouraged.  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

## 2019-12-07 NOTE — Telephone Encounter (Signed)
[name], huscher are scheduled for a virtual visit with your provider today.    Just as we do with appointments in the office, we must obtain your consent to participate.  Your consent will be active for this visit and any virtual visit you may have with one of our providers in the next 365 days.    If you have a MyChart account, I can also send a copy of this consent to you electronically.  All virtual visits are billed to your insurance company just like a traditional visit in the office.  As this is a virtual visit, video technology does not allow for your provider to perform a traditional examination.  This may limit your provider's ability to fully assess your condition.  If your provider identifies any concerns that need to be evaluated in person or the need to arrange testing such as labs, EKG, etc, we will make arrangements to do so.    Although advances in technology are sophisticated, we cannot ensure that it will always work on either your end or our end.  If the connection with a video visit is poor, we may have to switch to a telephone visit.  With either a video or telephone visit, we are not always able to ensure that we have a secure connection.   I need to obtain your verbal consent now.   Are you willing to proceed with your visit today?   Joseph Lozano has provided verbal consent on 12/07/2019 for a virtual visit (video or telephone).   Mitzie Na, RN 12/07/2019  11:07 AM

## 2019-12-13 DIAGNOSIS — H6983 Other specified disorders of Eustachian tube, bilateral: Secondary | ICD-10-CM | POA: Diagnosis not present

## 2019-12-13 DIAGNOSIS — H9041 Sensorineural hearing loss, unilateral, right ear, with unrestricted hearing on the contralateral side: Secondary | ICD-10-CM | POA: Diagnosis not present

## 2020-02-27 DIAGNOSIS — J019 Acute sinusitis, unspecified: Secondary | ICD-10-CM | POA: Diagnosis not present

## 2020-02-27 DIAGNOSIS — J069 Acute upper respiratory infection, unspecified: Secondary | ICD-10-CM | POA: Diagnosis not present

## 2020-02-27 DIAGNOSIS — J209 Acute bronchitis, unspecified: Secondary | ICD-10-CM | POA: Diagnosis not present

## 2020-03-03 ENCOUNTER — Other Ambulatory Visit: Payer: Self-pay

## 2020-03-03 DIAGNOSIS — U071 COVID-19: Secondary | ICD-10-CM | POA: Insufficient documentation

## 2020-03-03 DIAGNOSIS — R0789 Other chest pain: Secondary | ICD-10-CM | POA: Diagnosis not present

## 2020-03-03 DIAGNOSIS — J209 Acute bronchitis, unspecified: Secondary | ICD-10-CM | POA: Insufficient documentation

## 2020-03-03 DIAGNOSIS — R079 Chest pain, unspecified: Secondary | ICD-10-CM | POA: Diagnosis not present

## 2020-03-04 ENCOUNTER — Encounter (HOSPITAL_COMMUNITY): Payer: Self-pay | Admitting: Emergency Medicine

## 2020-03-04 ENCOUNTER — Emergency Department (HOSPITAL_COMMUNITY)
Admission: EM | Admit: 2020-03-04 | Discharge: 2020-03-04 | Disposition: A | Discharge status: Home / Self Care | Payer: BC Managed Care – PPO | Attending: Emergency Medicine | Admitting: Emergency Medicine

## 2020-03-04 ENCOUNTER — Emergency Department (HOSPITAL_COMMUNITY): Payer: BC Managed Care – PPO

## 2020-03-04 ENCOUNTER — Other Ambulatory Visit: Payer: Self-pay

## 2020-03-04 DIAGNOSIS — U071 COVID-19: Secondary | ICD-10-CM

## 2020-03-04 DIAGNOSIS — R079 Chest pain, unspecified: Secondary | ICD-10-CM | POA: Diagnosis not present

## 2020-03-04 DIAGNOSIS — J209 Acute bronchitis, unspecified: Secondary | ICD-10-CM

## 2020-03-04 LAB — BASIC METABOLIC PANEL
Anion gap: 10 (ref 5–15)
BUN: 18 mg/dL (ref 6–20)
CO2: 26 mmol/L (ref 22–32)
Calcium: 9 mg/dL (ref 8.9–10.3)
Chloride: 101 mmol/L (ref 98–111)
Creatinine, Ser: 0.94 mg/dL (ref 0.61–1.24)
GFR calc Af Amer: >60 mL/min (ref >60)
GFR calc non Af Amer: >60 mL/min (ref >60)
Glucose, Bld: 93 mg/dL (ref 70–99)
Potassium: 4 mmol/L (ref 3.5–5.1)
Sodium: 137 mmol/L (ref 135–145)

## 2020-03-04 LAB — CBC
HCT: 43.8 % (ref 39.0–52.0)
Hemoglobin: 13.9 g/dL (ref 13.0–17.0)
MCH: 26.8 pg (ref 26.0–34.0)
MCHC: 31.7 g/dL (ref 30.0–36.0)
MCV: 84.6 fL (ref 80.0–100.0)
Platelets: 152 10*3/uL (ref 150–400)
RBC: 5.18 MIL/uL (ref 4.22–5.81)
RDW: 13.2 % (ref 11.5–15.5)
WBC: 3.2 10*3/uL — ABNORMAL LOW (ref 4.0–10.5)
nRBC: 0 % (ref 0.0–0.2)

## 2020-03-04 LAB — TROPONIN I (HIGH SENSITIVITY): Troponin I (High Sensitivity): 4 ng/L (ref <18)

## 2020-03-04 LAB — SARS CORONAVIRUS 2 BY RT PCR (HOSPITAL ORDER, PERFORMED IN ~~LOC~~ HOSPITAL LAB): SARS Coronavirus 2: POSITIVE — AB

## 2020-03-04 MED ORDER — ALBUTEROL SULFATE HFA 108 (90 BASE) MCG/ACT IN AERS
2.0000 | INHALATION_SPRAY | Freq: Once | RESPIRATORY_TRACT | Status: AC
  Administered 2020-03-04: 2 via RESPIRATORY_TRACT
  Filled 2020-03-04: qty 6.7

## 2020-03-04 MED ORDER — PREDNISONE 50 MG PO TABS
50.0000 mg | ORAL_TABLET | Freq: Every day | ORAL | 0 refills | Status: DC
Start: 2020-03-04 — End: 2021-08-11

## 2020-03-04 MED ORDER — PREDNISONE 50 MG PO TABS
60.0000 mg | ORAL_TABLET | Freq: Once | ORAL | Status: AC
  Administered 2020-03-04: 60 mg via ORAL
  Filled 2020-03-04: qty 1

## 2020-03-04 MED ORDER — SODIUM CHLORIDE 0.9% FLUSH: 3.0000 mL | Freq: Once | INTRAVENOUS | Status: DC

## 2020-03-04 NOTE — ED Provider Notes (Addendum)
[facility] EMERGENCY DEPARTMENT Provider Note   CSN: [id number] Arrival date & time: 03/03/20  2344   History Chief Complaint  Patient presents with  . Chest Pain    Joseph Lozano is a 49 y.o. male.  The history is provided by the patient.  Chest Pain He is generally in good health and comes in because of tightness of his chest.  He started getting sick about 6 days ago with low-grade fever, nasal congestion and cough.  He had green rhinorrhea and green sputum.  He went to an urgent care center where he was diagnosed with influenza and bronchitis and sinusitis and given a prescription for azithromycin.  He has completed the course of azithromycin, and is no longer running fevers but continues to feel congested in the chest and head.  He has had a tight feeling in his chest over the last 2 days.  Nothing makes this better, nothing makes this worse.  He denies sick contacts.  He denies loss of sense of smell or taste.  There has been no nausea, vomiting, diarrhea.  He denies arthralgias or myalgias.  There has been no other treatment at home.  Of note, he has not received the coronavirus vaccine.  Past Medical History:  Diagnosis Date  . Fractures   . IBS (irritable bowel syndrome)   . Kidney stone   . Pneumonia     Patient Active Problem List   Diagnosis Date Noted  . Right sided abdominal pain 01/27/2018  . Obstructive sleep apnea 08/17/2013  . SHOULDER PAIN 04/22/2009  . IMPINGEMENT SYNDROME 04/22/2009    Past Surgical History:  Procedure Laterality Date  . COLONOSCOPY  2009   Dr. Oneida Alar: Normal  . COLONOSCOPY N/A 05/23/2018   Procedure: COLONOSCOPY;  Surgeon: Danie Binder, MD;  Location: [facility];  Service: Endoscopy;  Laterality: N/A;  2:15pm - office unable to reach pt to move up  . POLYPECTOMY  05/23/2018   Procedure: POLYPECTOMY;  Surgeon: Danie Binder, MD;  Location: [facility] SU[facility];  Service: Endoscopy;;  ascending colon polyp  . TONSILLECTOMY          Family History  Problem Relation Age of Onset  . Cancer Mother        Breast, lung  . Hypertension Maternal Grandmother   . Heart attack Maternal Grandmother   . Colon cancer Neg Hx     Social History   Tobacco Use  . Smoking status: Never Smoker  . Smokeless tobacco: Never Used  Vaping Use  . Vaping Use: Never used  Substance Use Topics  . Alcohol use: No  . Drug use: No    Home Medications Prior to Admission medications   Medication Sig Start Date End Date Taking? Authorizing Provider  albuterol (VENTOLIN HFA) 108 (90 Base) MCG/ACT inhaler Inhale 2 puffs into the lungs every 6 (six) hours as needed for wheezing or shortness of breath. 12/07/19   [name], MD  fluticasone (FLONASE) 50 MCG/ACT nasal spray Place 2 sprays into both nostrils daily. 10/11/18   [name], MD  Hyoscyamine Sulfate SL (LEVSIN/SL) 0.125 MG SUBL Take one every 8 hours prn cramps 08/30/19   [name], MD  OVER THE COUNTER MEDICATION Vit C Elderberry pill    [provider]  Probiotic Product (PROBIOTIC DAILY PO) Take by mouth.    [provider]    Allergies    Aspirin, Nsaids, and Sulfonamide derivatives  Review of Systems   Review  of Systems  Cardiovascular: Positive for chest pain.  All other systems reviewed and are negative.   Physical Exam Updated Vital Signs Ht 5\' 11"  (1.803 m)   Wt 72.6 kg   BMI 22.32 kg/m   Physical Exam Vitals and nursing note reviewed.   49 year old male, resting comfortably and in no acute distress. Vital signs are normal. Oxygen saturation is 98%, which is normal. Head is normocephalic and atraumatic. PERRLA, EOMI. Oropharynx is clear. Neck is nontender and supple without adenopathy or JVD. Back is nontender and there is no CVA tenderness. Lungs are clear without rales, wheezes, or rhonchi.  There is a slightly prolonged exhalation phase. Chest is nontender. Heart has regular rate and rhythm without  murmur. Abdomen is soft, flat, nontender without masses or hepatosplenomegaly and peristalsis is normoactive. Extremities have no cyanosis or edema, full range of motion is present. Skin is warm and dry without rash. Neurologic: Mental status is normal, cranial nerves are intact, there are no motor or sensory deficits.  ED Results / Procedures / Treatments   Labs (all labs ordered are listed, but only abnormal results are displayed) Labs Reviewed  SARS CORONAVIRUS 2 BY RT PCR ([facility]) - Abnormal; Notable for the following components:      Result Value   SARS Coronavirus 2 POSITIVE (*)    All other components within normal limits  CBC - Abnormal; Notable for the following components:   WBC 3.2 (*)    All other components within normal limits  BASIC METABOLIC PANEL  TROPONIN I (HIGH SENSITIVITY)  TROPONIN I (HIGH SENSITIVITY)    EKG EKG Interpretation  Date/Time:  Monday March 04 2020 00:14:44 EDT Ventricular Rate:  84 PR Interval:    QRS Duration: 88 QT Interval:  355 QTC Calculation: 420 R Axis:   -23 Text Interpretation: Sinus rhythm Borderline left axis deviation ST elev, probable normal early repol pattern No old tracing to compare Confirmed by Delora Fuel ([id number]) on 03/04/2020 12:17:21 AM   Radiology DG Chest 2 View  Result Date: 03/04/2020 CLINICAL DATA:  Chest pain EXAM: CHEST - 2 VIEW COMPARISON:  None. FINDINGS: The heart size and mediastinal contours are within normal limits. Mildly increased peribronchial markings seen within the perihilar region with streaky opacity at the right lung base. No pleural effusion or large airspace consolidation. The visualized skeletal structures are unremarkable. IMPRESSION: Findings which could be suggestive of mild bronchitis or reactive airway disease. Electronically Signed   By: Prudencio Pair M.D.   On: 03/04/2020 00:38    Procedures Procedures   Medications Ordered in  ED Medications  sodium chloride flush (NS) 0.9 % injection 3 mL (has no administration in time range)  albuterol (VENTOLIN HFA) 108 (90 Base) MCG/ACT inhaler 2 puff (has no administration in time range)    ED Course  I have reviewed the triage vital signs and the nursing notes.  Pertinent labs & imaging results that were available during my care of the patient were reviewed by me and considered in my medical decision making (see chart for details).  MDM Rules/Calculators/A&P Respiratory tract infection, possible COVID-19.  He is nontoxic in appearance and maintaining good oxygen saturation.  We will give a trial of albuterol via inhaler and will check nasal swab for COVID-19.  Chest x-ray is suggestive of bronchitis or reactive airway disease.  ECG is unremarkable.  Labs are pending.  Old records were reviewed, and he has no relevant past visits.  2:15 AM Labs show mild leukopenia consistent with a viral illness.  Troponin is normal.  Symptoms are not suggestive of ACS, no need for repeat troponin.  He feels much better after albuterol.  He is given a dose of prednisone and is discharged with an albuterol inhaler and prescription for prednisone.  Return precautions discussed.  Joseph Lozano was evaluated in Emergency Department on 03/04/2020 for the symptoms described in the history of present illness. He was evaluated in the context of the global COVID-19 pandemic, which necessitated consideration that the patient might be at risk for infection with the SARS-CoV-2 virus that causes COVID-19. Institutional protocols and algorithms that pertain to the evaluation of patients at risk for COVID-19 are in a state of rapid change based on information released by regulatory bodies including the CDC and federal and state organizations. These policies and algorithms were followed during the patient's care in the ED.  Final Clinical Impression(s) / ED Diagnoses Final diagnoses:  Acute bronchitis,  unspecified organism  COVID-19 virus infection    Rx / DC Orders ED Discharge Orders         Ordered    predniSONE (DELTASONE) 50 MG tablet  Daily     Discontinue  Reprint     03/04/20 5301           Delora Fuel, MD 11/04/57 0216  2:38 AM COVID-19 has come back positive.  He is given instructions regarding need for isolation in addition to the previously given discharge instructions.   Delora Fuel, MD 13/68/59 [phone]

## 2020-03-04 NOTE — ED Triage Notes (Signed)
Pt C/O chest tightness Wednesday. Pt reports he was seen at urgent care on Tuesday prescribed azithromycin for sinus infection. Pt reports he is no better. Denies SOB. Also reports cough.

## 2020-03-04 NOTE — Discharge Instructions (Addendum)
Use the inhaler as needed.  You may take 2 puffs every 4 hours to help with cough, difficulty breathing, or tight feeling in your chest.  Please check the COVID-19 test results on MyChart.  Return if your symptoms are getting worse.

## 2020-03-13 ENCOUNTER — Other Ambulatory Visit: Payer: Self-pay

## 2020-03-13 ENCOUNTER — Encounter: Payer: Self-pay | Admitting: Family Medicine

## 2020-03-13 ENCOUNTER — Ambulatory Visit (INDEPENDENT_AMBULATORY_CARE_PROVIDER_SITE_OTHER): Payer: BC Managed Care – PPO | Admitting: Family Medicine

## 2020-03-13 VITALS — HR 83 | Temp 97.0°F | Resp 16

## 2020-03-13 DIAGNOSIS — J019 Acute sinusitis, unspecified: Secondary | ICD-10-CM | POA: Diagnosis not present

## 2020-03-13 DIAGNOSIS — Z20822 Contact with and (suspected) exposure to covid-19: Secondary | ICD-10-CM | POA: Insufficient documentation

## 2020-03-13 DIAGNOSIS — U071 COVID-19: Secondary | ICD-10-CM | POA: Diagnosis not present

## 2020-03-13 DIAGNOSIS — R05 Cough: Secondary | ICD-10-CM

## 2020-03-13 DIAGNOSIS — R058 Other specified cough: Secondary | ICD-10-CM

## 2020-03-13 MED ORDER — PSEUDOEPH-BROMPHEN-DM 30-2-10 MG/5ML PO SYRP: 5.0000 mL | ORAL_SOLUTION | Freq: Four times a day (QID) | ORAL | 0 refills | Status: DC | PRN

## 2020-03-13 MED ORDER — FLUTICASONE PROPIONATE 50 MCG/ACT NA SUSP: 2.0000 | Freq: Every day | NASAL | 6 refills | Status: DC

## 2020-03-13 MED ORDER — AMOXICILLIN-POT CLAVULANATE 875-125 MG PO TABS: 1.0000 | ORAL_TABLET | Freq: Two times a day (BID) | ORAL | 0 refills | Status: DC

## 2020-03-13 NOTE — Progress Notes (Signed)
Patient ID: Joseph Lozano, male    DOB: Dec 24, 1970, 49 y.o.   MRN: [id number]   Chief Complaint  Patient presents with  . Follow-up   Subjective:    HPI  Symptoms of Covid started 3 weeks ago and went to urgent care where they diagnosed him with flu (no testing done). Proceeds to get worse and went to ED on 8/2 when he was diagnosed with Covid-19. Reports cough, sinus pain and pressure, and xray was "almost pneumonia".    Medical History [place] has a past medical history of Fractures, IBS (irritable bowel syndrome), Kidney stone, and Pneumonia.   Outpatient Encounter Medications as of 03/13/2020  Medication Sig  . albuterol (VENTOLIN HFA) 108 (90 Base) MCG/ACT inhaler Inhale 2 puffs into the lungs every 6 (six) hours as needed for wheezing or shortness of breath.  Marland Kitchen amoxicillin-clavulanate (AUGMENTIN) 875-125 MG tablet Take 1 tablet by mouth 2 (two) times daily.  . brompheniramine-pseudoephedrine-DM 30-2-10 MG/5ML syrup Take 5 mLs by mouth 4 (four) times daily as needed.  . fluticasone (FLONASE) 50 MCG/ACT nasal spray Place 2 sprays into both nostrils daily.  Marland Kitchen Hyoscyamine Sulfate SL (LEVSIN/SL) 0.125 MG SUBL Take one every 8 hours prn cramps  . OVER THE COUNTER MEDICATION Vit C Elderberry pill  . predniSONE (DELTASONE) 50 MG tablet Take 1 tablet (50 mg total) by mouth daily.  . Probiotic Product (PROBIOTIC DAILY PO) Take by mouth.  . [DISCONTINUED] fluticasone (FLONASE) 50 MCG/ACT nasal spray Place 2 sprays into both nostrils daily.  . [DISCONTINUED] fluticasone (FLONASE) 50 MCG/ACT nasal spray Place 2 sprays into both nostrils daily.   No facility-administered encounter medications on file as of 03/13/2020.     Review of Systems  Constitutional: Positive for fatigue. Negative for chills and fever.  HENT: Positive for congestion, postnasal drip, sinus pressure and sinus pain. Negative for ear discharge, ear pain, hearing loss, sneezing and sore throat.   Eyes: Negative.     Respiratory: Positive for cough. Negative for wheezing.   Cardiovascular: Negative.   Gastrointestinal: Negative.   Endocrine: Negative.   Musculoskeletal: Negative.   Skin: Negative.   Neurological: Negative.   Hematological: Negative.   Psychiatric/Behavioral: Negative.      Vitals Pulse 83   Temp (!) 97 F (36.1 C)   Resp 16   SpO2 99%   Objective:   Physical Exam Constitutional:      Appearance: Normal appearance.  HENT:     Right Ear: Tympanic membrane normal.     Left Ear: Tympanic membrane normal.     Nose: Nasal tenderness, congestion and rhinorrhea present.     Right Sinus: Maxillary sinus tenderness and frontal sinus tenderness present.     Left Sinus: Maxillary sinus tenderness and frontal sinus tenderness present.     Mouth/Throat:     Mouth: Mucous membranes are moist.  Cardiovascular:     Rate and Rhythm: Normal rate and regular rhythm.     Heart sounds: Normal heart sounds.  Pulmonary:     Effort: Pulmonary effort is normal.     Breath sounds: Normal breath sounds.  Musculoskeletal:     Cervical back: Normal range of motion.  Skin:    General: Skin is warm and dry.  Neurological:     Mental Status: He is alert and oriented to person, place, and time.      Assessment and Plan   1. Acute rhinosinusitis - amoxicillin-clavulanate (AUGMENTIN) 875-125 MG tablet; Take 1 tablet by mouth 2 (two) times  daily.  Dispense: 20 tablet; Refill: 0  2. Cough with exposure to severe acute respiratory syndrome coronavirus 2 (SARS-CoV-2) - brompheniramine-pseudoephedrine-DM 30-2-10 MG/5ML syrup; Take 5 mLs by mouth 4 (four) times daily as needed.  Dispense: 120 mL; Refill: 0  Joseph Lozano presents after 3 weeks of onset of Covid-19 symptoms. Continues to have significant pain and tenderness over maxillary and frontal sinuses. Lungs are clear, feels tightness over bronchials consistent with bronchitis. Was treated with Z-pak for rhino sinusitis- treatment  failure.  Rhinosinusitis:  Augmentin 875/125 mg by mouth two times daily. Nasal congestion: Flonase nose spray 2 sprays in nostrils daily Brompheniramine-pseudoephedrine 5 ml 4 times per day as needed for cough. Do not use Albuterol inhaler with Flonase nose spray ( use one in the morning and the other one at night).  Understands that these symptoms may be around for a long time post Covid infection. Agrees with plan of care. Follow-up if symptoms persist or get worse.  Appears to be on the road to recovery and informed me the health department relieved him of his quarantine period last Thursday.   Joseph Ades, NP

## 2021-04-10 ENCOUNTER — Encounter: Payer: Self-pay | Admitting: *Deleted

## 2021-08-11 ENCOUNTER — Encounter: Payer: Self-pay | Admitting: Family Medicine

## 2021-08-11 ENCOUNTER — Other Ambulatory Visit: Payer: Self-pay

## 2021-08-11 ENCOUNTER — Ambulatory Visit: Payer: BC Managed Care – PPO | Admitting: Family Medicine

## 2021-08-11 VITALS — BP 150/85 | HR 81 | Temp 98.2°F | Ht 71.0 in | Wt 199.0 lb

## 2021-08-11 DIAGNOSIS — J019 Acute sinusitis, unspecified: Secondary | ICD-10-CM | POA: Insufficient documentation

## 2021-08-11 DIAGNOSIS — U099 Post covid-19 condition, unspecified: Secondary | ICD-10-CM | POA: Insufficient documentation

## 2021-08-11 DIAGNOSIS — R030 Elevated blood-pressure reading, without diagnosis of hypertension: Secondary | ICD-10-CM

## 2021-08-11 DIAGNOSIS — E7800 Pure hypercholesterolemia, unspecified: Secondary | ICD-10-CM

## 2021-08-11 DIAGNOSIS — D72819 Decreased white blood cell count, unspecified: Secondary | ICD-10-CM | POA: Diagnosis not present

## 2021-08-11 DIAGNOSIS — Z125 Encounter for screening for malignant neoplasm of prostate: Secondary | ICD-10-CM

## 2021-08-11 DIAGNOSIS — R0609 Other forms of dyspnea: Secondary | ICD-10-CM | POA: Insufficient documentation

## 2021-08-11 MED ORDER — AMOXICILLIN-POT CLAVULANATE 875-125 MG PO TABS: 1.0000 | ORAL_TABLET | Freq: Two times a day (BID) | ORAL | 0 refills | Status: DC

## 2021-08-11 NOTE — Assessment & Plan Note (Signed)
Treating with Augmentin. 

## 2021-08-11 NOTE — Patient Instructions (Signed)
Antibiotic as prescribed.  Labs (fasting) - [facility]  Chest xray when you can (at the hospital).  I will place referral to pulmonology.  Physical this year.  Take care  Dr. Lacinda Axon

## 2021-08-11 NOTE — Progress Notes (Signed)
Subjective:  Patient ID: Joseph Lozano, male    DOB: October 05, 1970  Age: 51 y.o. MRN: [id number]  CC: Chief Complaint  Patient presents with   Cough    Chest congestion x 1.5 weeks took otc mucinex dm , nyquil   Sinus Problem    Nasal congestion x 1.5 weeks took afrin nasal spray 2 negative home covid test - negative    HPI:  51 year old male presents with the above complaints.  Patient states that for the past 1.5 weeks he has had nasal congestion and sinus pressure.  Has also had cough.  He reports productive cough as well as discolored nasal discharge.  He has had negative COVID testing.  He has been using over-the-counter medication without relief.  No fever.  No reported sick contacts.  Additionally, patient reports that he has had ongoing shortness of breath with exertion since having COVID in August 2021.  He reports intermittent headache as well as fatigue.  He states that this is out of character for him.  He has not had recent laboratory studies.  He would like to discuss this today.  Patient Active Problem List   Diagnosis Date Noted   DOE (dyspnea on exertion) 08/11/2021   Acute sinusitis 08/11/2021   Obstructive sleep apnea 08/17/2013    Social Hx   Social History   Socioeconomic History   Marital status: Married    Spouse name: Not on file   Number of children: Not on file   Years of education: Not on file   Highest education level: Not on file  Occupational History   Not on file  Tobacco Use   Smoking status: Never   Smokeless tobacco: Never  Vaping Use   Vaping Use: Never used  Substance and Sexual Activity   Alcohol use: No   Drug use: No   Sexual activity: Not on file  Other Topics Concern   Not on file  Social History Narrative   Not on file   Social Determinants of Health   Financial Resource Strain: Not on file  Food Insecurity: Not on file  Transportation Needs: Not on file  Physical Activity: Not on file  Stress: Not on file  Social  Connections: Not on file    Review of Systems Per HPI  Objective:  BP (!) 150/85    Pulse 81    Temp 98.2 F (36.8 C)    Ht 5' 11"  (1.803 m)    Wt 199 lb (90.3 kg)    SpO2 98%    BMI 27.75 kg/m   BP/Weight 08/11/2021 03/04/2020 5/53/7482  Systolic BP 707 867 544  Diastolic BP 85 78 88  Wt. (Lbs) 199 160 194  BMI 27.75 22.32 27.06    Physical Exam Vitals and nursing note reviewed.  Constitutional:      General: He is not in acute distress.    Appearance: Normal appearance. He is not ill-appearing.  HENT:     Head: Normocephalic and atraumatic.     Right Ear: Tympanic membrane normal.     Left Ear: Tympanic membrane normal.     Mouth/Throat:     Pharynx: Oropharynx is clear.  Eyes:     General:        Right eye: No discharge.        Left eye: No discharge.     Conjunctiva/sclera: Conjunctivae normal.  Cardiovascular:     Rate and Rhythm: Normal rate and regular rhythm.  Pulmonary:     Effort:  Pulmonary effort is normal.     Breath sounds: Normal breath sounds. No wheezing, rhonchi or rales.  Neurological:     Mental Status: He is alert.  Psychiatric:        Mood and Affect: Mood normal.        Behavior: Behavior normal.    Lab Results  Component Value Date   WBC 3.2 (L) 03/04/2020   HGB 13.9 03/04/2020   HCT 43.8 03/04/2020   PLT 152 03/04/2020   GLUCOSE 93 03/04/2020   CHOL 197 11/03/2017   TRIG 72 11/03/2017   HDL 58 11/03/2017   LDLCALC 125 (H) 11/03/2017   ALT 29 11/03/2017   AST 20 11/03/2017   NA 137 03/04/2020   K 4.0 03/04/2020   CL 101 03/04/2020   CREATININE 0.94 03/04/2020   BUN 18 03/04/2020   CO2 26 03/04/2020     Assessment & Plan:   Problem List Items Addressed This Visit       Respiratory   Acute sinusitis - Primary    Treating with Augmentin.      Relevant Medications   amoxicillin-clavulanate (AUGMENTIN) 875-125 MG tablet     Other   DOE (dyspnea on exertion)    Uncertain etiology.  Could be related to post COVID.   However, this has been going on for quite a long time.  Labs ordered.  Chest x-ray ordered.  Referring to pulmonology.  Will need pulmonary function testing.      Relevant Orders   DG Chest 2 View   Ambulatory referral to Pulmonology   Other Visit Diagnoses     Pure hypercholesterolemia       Relevant Orders   Lipid panel   Elevated BP without diagnosis of hypertension       Relevant Orders   CMP14+EGFR   Leukopenia, unspecified type       Relevant Orders   CBC   Screening for prostate cancer       Relevant Orders   PSA       Meds ordered this encounter  Medications   amoxicillin-clavulanate (AUGMENTIN) 875-125 MG tablet    Sig: Take 1 tablet by mouth 2 (two) times daily.    Dispense:  20 tablet    Refill:  [place]

## 2021-08-11 NOTE — Assessment & Plan Note (Signed)
Uncertain etiology.  Could be related to post COVID.  However, this has been going on for quite a long time.  Labs ordered.  Chest x-ray ordered.  Referring to pulmonology.  Will need pulmonary function testing.

## 2021-08-12 ENCOUNTER — Ambulatory Visit (HOSPITAL_COMMUNITY)
Admission: RE | Admit: 2021-08-12 | Discharge: 2021-08-12 | Disposition: A | Discharge status: Home / Self Care | Payer: BC Managed Care – PPO | Source: Ambulatory Visit | Attending: Family Medicine | Admitting: Family Medicine

## 2021-08-12 DIAGNOSIS — R0609 Other forms of dyspnea: Secondary | ICD-10-CM | POA: Insufficient documentation

## 2021-08-12 DIAGNOSIS — E7800 Pure hypercholesterolemia, unspecified: Secondary | ICD-10-CM | POA: Diagnosis not present

## 2021-08-12 DIAGNOSIS — Z125 Encounter for screening for malignant neoplasm of prostate: Secondary | ICD-10-CM | POA: Diagnosis not present

## 2021-08-12 DIAGNOSIS — D72819 Decreased white blood cell count, unspecified: Secondary | ICD-10-CM | POA: Diagnosis not present

## 2021-08-12 DIAGNOSIS — R030 Elevated blood-pressure reading, without diagnosis of hypertension: Secondary | ICD-10-CM | POA: Diagnosis not present

## 2021-08-13 LAB — LIPID PANEL
Chol/HDL Ratio: 3.2 ratio (ref 0.0–5.0)
Cholesterol, Total: 214 mg/dL — ABNORMAL HIGH (ref 100–199)
HDL: 66 mg/dL (ref >39)
LDL Chol Calc (NIH): 128 mg/dL — ABNORMAL HIGH (ref 0–99)
Triglycerides: 115 mg/dL (ref 0–149)
VLDL Cholesterol Cal: 20 mg/dL (ref 5–40)

## 2021-08-13 LAB — CMP14+EGFR
ALT: 23 IU/L (ref 0–44)
AST: 22 IU/L (ref 0–40)
Albumin/Globulin Ratio: 1.7 (ref 1.2–2.2)
Albumin: 4.9 g/dL (ref 3.8–4.9)
Alkaline Phosphatase: 71 IU/L (ref 44–121)
BUN/Creatinine Ratio: 17 (ref 9–20)
BUN: 16 mg/dL (ref 6–24)
Bilirubin Total: 0.7 mg/dL (ref 0.0–1.2)
CO2: 25 mmol/L (ref 20–29)
Calcium: 9.9 mg/dL (ref 8.7–10.2)
Chloride: 100 mmol/L (ref 96–106)
Creatinine, Ser: 0.92 mg/dL (ref 0.76–1.27)
Globulin, Total: 2.9 g/dL (ref 1.5–4.5)
Glucose: 90 mg/dL (ref 70–99)
Potassium: 4.5 mmol/L (ref 3.5–5.2)
Sodium: 140 mmol/L (ref 134–144)
Total Protein: 7.8 g/dL (ref 6.0–8.5)
eGFR: 100 mL/min/{1.73_m2} (ref >59)

## 2021-08-13 LAB — CBC
Hematocrit: 47.5 % (ref 37.5–51.0)
Hemoglobin: 15.5 g/dL (ref 13.0–17.7)
MCH: 26.9 pg (ref 26.6–33.0)
MCHC: 32.6 g/dL (ref 31.5–35.7)
MCV: 82 fL (ref 79–97)
Platelets: 267 10*3/uL (ref 150–450)
RBC: 5.77 x10E6/uL (ref 4.14–5.80)
RDW: 13 % (ref 11.6–15.4)
WBC: 7.2 10*3/uL (ref 3.4–10.8)

## 2021-08-13 LAB — PSA: Prostate Specific Ag, Serum: 0.9 ng/mL (ref 0.0–4.0)

## 2021-08-29 ENCOUNTER — Encounter: Payer: Self-pay | Admitting: Pulmonary Disease

## 2021-08-29 ENCOUNTER — Other Ambulatory Visit: Payer: Self-pay

## 2021-08-29 ENCOUNTER — Ambulatory Visit: Payer: BC Managed Care – PPO | Admitting: Pulmonary Disease

## 2021-08-29 VITALS — BP 116/70 | HR 76 | Ht 72.0 in | Wt 197.0 lb

## 2021-08-29 DIAGNOSIS — R0609 Other forms of dyspnea: Secondary | ICD-10-CM

## 2021-08-29 DIAGNOSIS — U099 Post covid-19 condition, unspecified: Secondary | ICD-10-CM | POA: Diagnosis not present

## 2021-08-29 MED ORDER — ALBUTEROL SULFATE HFA 108 (90 BASE) MCG/ACT IN AERS: 2.0000 | INHALATION_SPRAY | Freq: Four times a day (QID) | RESPIRATORY_TRACT | 3 refills | Status: AC | PRN

## 2021-08-29 NOTE — Progress Notes (Signed)
Subjective:   PATIENT ID: Joseph Lozano GENDER: male DOB: [birth year], MRN: [id number]   HPI  Chief Complaint  Patient presents with   Consult    DOE after covid    Reason for Visit: New consult for COVID  Mr. [name] is a 51 year old male never smoker with history of COVID-19 in 02/2020 who presents for shortness of breath.   He established care with Dr. Lacinda Axon on 08/11/21. He was seen for sinus congestion and cough for 1.5 weeks. Given Augmentin. He reports after antibiotics, he reports his sinus symptoms improved. However had wheezing which he expresses concern. Pulmonary referral placed.  He reports he was diagnosed with COVID July 2021 at [facility]. He returned to the ED 03/04/20 in August . ED note reviewed. He presented with chest pain and found positive. Treated with steroids and albuterol. Did not need oxygen. He reports that it took a month before returning to his baseline activity however still had persistent fatigue, shortness of breath, wheezing and coughing and wheezing. In the last six months, he still has fatigue and decreased energy, cough with moderate activity that was never present before. Wheezing resolved but recurs with illnesses. At baseline he runs on the treadmilll 30 minutes every other day and resistance training.   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  Past Medical History:  Diagnosis Date   Fractures    IBS (irritable bowel syndrome)    Kidney stone    Pneumonia      Family History  Problem Relation Age of Onset   Cancer Mother        Breast, lung   Hypertension Maternal Grandmother    Heart attack Maternal Grandmother    Colon cancer Neg Hx      Social History   Occupational History   Not on file  Tobacco Use   Smoking status: Never    Passive exposure: Past   Smokeless tobacco: Never  Vaping Use   Vaping Use:  Never used  Substance and Sexual Activity   Alcohol use: No   Drug use: No   Sexual activity: Not on file    Allergies  Allergen Reactions   Aspirin     Throat closed as a child   Nsaids     Tolerates ibu   Sulfonamide Derivatives     Muscle cramps and joint pain     Outpatient Medications Prior to Visit  Medication Sig Dispense Refill   amoxicillin-clavulanate (AUGMENTIN) 875-125 MG tablet Take 1 tablet by mouth 2 (two) times daily. (Patient not taking: Reported on 08/29/2021) 20 tablet 0   No facility-administered medications prior to visit.    Review of Systems  Constitutional:  Positive for malaise/fatigue. Negative for chills, diaphoresis, fever and weight loss.  HENT:  Negative for congestion, ear pain and sore throat.   Respiratory:  Positive for cough. Negative for hemoptysis, sputum production, shortness of breath and wheezing.   Cardiovascular:  Negative for chest pain, palpitations and leg swelling.  Gastrointestinal:  Negative for abdominal pain, heartburn and nausea.  Genitourinary:  Negative for frequency.  Musculoskeletal:  Negative for joint pain and myalgias.  Skin:  Negative for itching and rash.  Neurological:  Negative for dizziness, weakness and headaches.  Endo/Heme/Allergies:  Does not bruise/bleed easily.  Psychiatric/Behavioral:  Negative for depression. The patient is not nervous/anxious.     Objective:   Vitals:   08/29/21  1532  BP: 116/70  Pulse: 76  SpO2: 97%  Weight: 197 lb (89.4 kg)  Height: 6' (1.829 m)   SpO2: 97 % O2 Device: None (Room air)  Physical Exam: General: Well-appearing, no acute distress HENT: [place], AT Eyes: EOMI, no scleral icterus Respiratory: Clear to auscultation bilaterally.  No crackles, wheezing or rales Cardiovascular: RRR, -M/R/G, no JVD Extremities:-Edema,-tenderness Neuro: AAO x4, CNII-XII grossly intact Psych: Normal mood, normal affect  Data Reviewed:  Imaging: CXR 08/12/21 - No infiltrate, effusion  or edema CT A/P 03/15/18 - No parenchymal abnormalities. No infiltrate, effusion or edema  PFT: None on file  Labs: CBC    Component Value Date/Time   WBC 7.2 08/12/2021 1345   WBC 3.2 (L) 03/04/2020 0107   RBC 5.77 08/12/2021 1345   RBC 5.18 03/04/2020 0107   HGB 15.5 08/12/2021 1345   HCT 47.5 08/12/2021 1345   PLT 267 08/12/2021 1345   MCV 82 08/12/2021 1345   MCH 26.9 08/12/2021 1345   MCH 26.8 03/04/2020 0107   MCHC 32.6 08/12/2021 1345   MCHC 31.7 03/04/2020 0107   RDW 13.0 08/12/2021 1345   LYMPHSABS 2.2 11/03/2017 1024   MONOABS 1.2 (H) 01/30/2010 0219   EOSABS 0.3 11/03/2017 1024   BASOSABS 0.0 11/03/2017 1024   Absolute eos 11/03/17 - 300     Assessment & Plan:   Discussion: 51 year old male never smoker with history of COVID-19 in 02/2020 who presents for shortness of breath  COVID-19 long hauler manifesting chronic dyspnea --START Albuterol inhaler TWO puffs AS NEEDED for shortness of breath or wheezing --ARRANGE for pulmonary function tests --If tests are abnormal, we discuss bronchodilators if appropriate  Health Maintenance Immunization History  Administered Date(s) Administered   Influenza-Unspecified 06/03/2018   CT Lung Screen - not qualified  Orders Placed This Encounter  Procedures   Pulmonary function test    Standing Status:   Future    Standing Expiration Date:   08/29/2022    Order Specific Question:   Where should this test be performed?    Answer:   [facility]    Order Specific Question:   Full PFT: includes the following: basic spirometry, spirometry pre & post bronchodilator, diffusion capacity (DLCO), lung volumes    Answer:   Full PFT   Meds ordered this encounter  Medications   albuterol (VENTOLIN HFA) 108 (90 Base) MCG/ACT inhaler    Sig: Inhale 2 puffs into the lungs every 6 (six) hours as needed for wheezing or shortness of breath.    Dispense:  8 g    Refill:  3    No follow-ups on file. After PFTs  I have spent  a total time of 45-minutes on the day of the appointment reviewing prior documentation, coordinating care and discussing medical diagnosis and plan with the patient/family. Imaging, labs and tests included in this note have been reviewed and interpreted independently by me.  [place], MD [facility] 08/29/2021 3:44 PM  Office Number [phone]

## 2021-08-29 NOTE — Patient Instructions (Signed)
COVID-19 long hauler manifesting chronic dyspnea --START Albuterol inhaler TWO puffs AS NEEDED for shortness of breath or wheezing --ARRANGE for pulmonary function tests --If tests are abnormal, we discuss bronchodilators if appropriate

## 2021-09-02 ENCOUNTER — Ambulatory Visit (INDEPENDENT_AMBULATORY_CARE_PROVIDER_SITE_OTHER): Payer: BC Managed Care – PPO | Admitting: Pulmonary Disease

## 2021-09-02 ENCOUNTER — Other Ambulatory Visit: Payer: Self-pay

## 2021-09-02 DIAGNOSIS — R0609 Other forms of dyspnea: Secondary | ICD-10-CM

## 2021-09-02 DIAGNOSIS — U099 Post covid-19 condition, unspecified: Secondary | ICD-10-CM

## 2021-09-02 LAB — PULMONARY FUNCTION TEST
DL/VA % pred: 83 %
DL/VA: 3.69 ml/min/mmHg/L
DLCO cor % pred: 87 %
DLCO cor: 24.69 ml/min/mmHg
DLCO unc % pred: 89 %
DLCO unc: 25.3 ml/min/mmHg
FEF 25-75 Post: 3.86 L/sec
FEF 25-75 Pre: 3.14 L/sec
FEF2575-%Change-Post: 22 %
FEF2575-%Pred-Post: 117 %
FEF2575-%Pred-Pre: 95 %
FEV1-%Change-Post: 6 %
FEV1-%Pred-Post: 106 %
FEV1-%Pred-Pre: 99 %
FEV1-Post: 3.98 L
FEV1-Pre: 3.74 L
FEV1FVC-%Change-Post: 1 %
FEV1FVC-%Pred-Pre: 99 %
FEV6-%Change-Post: 5 %
FEV6-%Pred-Post: 109 %
FEV6-%Pred-Pre: 103 %
FEV6-Post: 5.09 L
FEV6-Pre: 4.84 L
FEV6FVC-%Change-Post: 0 %
FEV6FVC-%Pred-Post: 103 %
FEV6FVC-%Pred-Pre: 103 %
FVC-%Change-Post: 5 %
FVC-%Pred-Post: 105 %
FVC-%Pred-Pre: 100 %
FVC-Post: 5.1 L
FVC-Pre: 4.86 L
Post FEV1/FVC ratio: 78 %
Post FEV6/FVC ratio: 100 %
Pre FEV1/FVC ratio: 77 %
Pre FEV6/FVC Ratio: 100 %
RV % pred: 103 %
RV: 2.1 L
TLC % pred: 100 %
TLC: 6.82 L

## 2021-09-02 NOTE — Progress Notes (Signed)
PFT done today. 

## 2021-09-04 ENCOUNTER — Other Ambulatory Visit: Payer: Self-pay | Admitting: Family Medicine

## 2021-09-04 ENCOUNTER — Telehealth: Payer: Self-pay | Admitting: Family Medicine

## 2021-09-04 MED ORDER — PROMETHAZINE-DM 6.25-15 MG/5ML PO SYRP: 5.0000 mL | ORAL_SOLUTION | Freq: Four times a day (QID) | ORAL | 0 refills | Status: DC | PRN

## 2021-09-04 NOTE — Telephone Encounter (Signed)
Patient states he has been taking OTC meds for over a week week with no relief and is still coughing up congestion

## 2021-09-04 NOTE — Telephone Encounter (Signed)
Patient was seen on 08/11/21 and given antibiotic he states finished it up but now he is back with congestion coughing again. Will he need to be seen again or can you can in something to [facility]

## 2021-09-04 NOTE — Telephone Encounter (Signed)
Thersa Salt G, DO    Doubt bacterial infection at this time especially given recent antibiotic course. Recommend OTC Zyrtec D, use of albuterol and OTC Delsym.

## 2021-09-04 NOTE — Telephone Encounter (Signed)
Patient stated he picked up cough med but really feels he needs a stronger antibiotic and that the Amoxil did not get rid of infection. Patient scheduled recheck tomorrow with Hoyle Sauer NP

## 2021-09-05 ENCOUNTER — Ambulatory Visit: Payer: BC Managed Care – PPO | Admitting: Nurse Practitioner

## 2021-09-05 ENCOUNTER — Encounter: Payer: Self-pay | Admitting: Nurse Practitioner

## 2021-09-05 ENCOUNTER — Other Ambulatory Visit: Payer: Self-pay

## 2021-09-05 VITALS — BP 111/76 | HR 81 | Temp 97.6°F | Ht 72.0 in | Wt 197.0 lb

## 2021-09-05 DIAGNOSIS — J019 Acute sinusitis, unspecified: Secondary | ICD-10-CM | POA: Diagnosis not present

## 2021-09-05 DIAGNOSIS — B9689 Other specified bacterial agents as the cause of diseases classified elsewhere: Secondary | ICD-10-CM | POA: Diagnosis not present

## 2021-09-05 DIAGNOSIS — R0609 Other forms of dyspnea: Secondary | ICD-10-CM | POA: Diagnosis not present

## 2021-09-05 DIAGNOSIS — U099 Post covid-19 condition, unspecified: Secondary | ICD-10-CM

## 2021-09-05 MED ORDER — LEVOFLOXACIN 500 MG PO TABS: 500.0000 mg | ORAL_TABLET | Freq: Every day | ORAL | 0 refills | Status: DC

## 2021-09-05 MED ORDER — PREDNISONE 20 MG PO TABS: ORAL_TABLET | ORAL | 0 refills | Status: DC

## 2021-09-05 NOTE — Patient Instructions (Signed)
Flonase or Nasacort AQ (steroid nasal spray)

## 2021-09-05 NOTE — Progress Notes (Signed)
° °  Subjective:    Patient ID: Joseph Lozano, male    DOB: 01-Aug-1971, 51 y.o.   MRN: [id number]  Cough  Patient has a productive cough, nasal congestion and chest congestion. He said the took an antibiotic and was better for a couple of days but now symptoms have come back worse, especially at night. Patient completed a course of Augmentin, symptoms were slightly better but came back once this was done and are actually worse at this point.  At this time having a cough producing greenish mucus.  Nasal congestion.  Sore throat.  No ear pain.  No fever.  Slight chest pain only at nighttime.  No wheezing.  Has used his albuterol inhaler about 3 times per week on average.  Headache around the eye area, better with OTC analgesics.  Describes congestion in the upper chest with a rattle at nighttime.  Taking fluids well.  No nausea vomiting or diarrhea.  Voiding normal limit.  Has struggled with shortness of breath since having COVID August 2021.  Has an appointment with pulmonology for evaluation including spirometry. Patient will be leaving for a cruise in 1 week.  Non-smoker.       Objective:   Physical Exam NAD.  Alert, oriented.  TMs retracted, more on the left, color normal limit.  Pharynx mildly injected with greenish PND noted.  Neck supple with moderate soft nontender anterior cervical adenopathy.  Lungs clear.  Heart regular rate and rhythm.  Occasional slight cough noted with frequent clearing of the throat.  Today's Vitals   09/05/21 1052  BP: 111/76  Pulse: 81  Temp: 97.6 F (36.4 C)  TempSrc: Oral  SpO2: 98%  Weight: 197 lb (89.4 kg)  Height: 6' (1.829 m)   Body mass index is 26.72 kg/m.        Assessment & Plan:   Problem List Items Addressed This Visit       Other   COVID-19 long hauler manifesting chronic dyspnea   Other Visit Diagnoses     Acute bacterial rhinosinusitis    -  Primary   Relevant Medications   levofloxacin (LEVAQUIN) 500 MG tablet   predniSONE  (DELTASONE) 20 MG tablet      Meds ordered this encounter  Medications   levofloxacin (LEVAQUIN) 500 MG tablet    Sig: Take 1 tablet (500 mg total) by mouth daily.    Dispense:  7 tablet    Refill:  0    Order Specific Question:   Supervising Provider    Answer:   Sallee Lange A [[id number]]   predniSONE (DELTASONE) 20 MG tablet    Sig: Take 2 tabs (40 mg) po qd x 5 d    Dispense:  10 tablet    Refill:  0    Order Specific Question:   Supervising Provider    Answer:   Kathyrn Drown [[id number]]   Since patient just completed a course of Augmentin and has plans to go out of town in 1 week, will treat with Levaquin for 7 days along with prednisone.  Start OTC steroid nasal spray today.  Warning signs reviewed.  Patient to call back next week if no improvement, go to ED or urgent care sooner if worse.  Patient to follow-up with pulmonology as planned for evaluation for post-COVID issues.

## 2021-09-09 ENCOUNTER — Ambulatory Visit: Payer: BC Managed Care – PPO | Admitting: Pulmonary Disease

## 2021-09-09 NOTE — Progress Notes (Deleted)
Subjective:   PATIENT ID: Joseph Lozano: male DOB: [birth year], MRN: [id number]   HPI  No chief complaint on file.   Reason for Visit: Follow-up  [name] is a 51 year old male never smoker with history of COVID-19 in 02/2020 who presents for shortness of breath.   Synopsis: History of COVID-19 in 02/2020. Treated with steroids and albuterol. Did not need oxygen. He reports that it took a month before returning to his baseline activity however still had persistent fatigue, shortness of breath, wheezing and coughing and wheezing. In the last six months, he still has fatigue and decreased energy, cough with moderate activity that was never present before. Wheezing resolved but recurs with illnesses. At baseline he runs on the treadmilll 30 minutes every other day and resistance training.   08/29/21 He established care with Dr. Lacinda Axon on 08/11/21. He was seen for sinus congestion and cough for 1.5 weeks. Given Augmentin. He reports after antibiotics, he reports his sinus symptoms improved. However had wheezing which he expresses concern. Pulmonary referral placed.  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  Past Medical History:  Diagnosis Date   Fractures    IBS (irritable bowel syndrome)    Kidney stone    Pneumonia      Family History  Problem Relation Age of Onset   Cancer Mother        Breast, lung   Hypertension Maternal Grandmother    Heart attack Maternal Grandmother    Colon cancer Neg Hx      Social History   Occupational History   Not on file  Tobacco Use   Smoking status: Never    Passive exposure: Past   Smokeless tobacco: Never  Vaping Use   Vaping Use: Never used  Substance and Sexual Activity   Alcohol use: No   Drug use: No   Sexual activity: Not on file    Allergies  Allergen Reactions    Aspirin     Throat closed as a child   Nsaids     Tolerates ibu   Sulfonamide Derivatives     Muscle cramps and joint pain     Outpatient Medications Prior to Visit  Medication Sig Dispense Refill   albuterol (VENTOLIN HFA) 108 (90 Base) MCG/ACT inhaler Inhale 2 puffs into the lungs every 6 (six) hours as needed for wheezing or shortness of breath. 8 g 3   levofloxacin (LEVAQUIN) 500 MG tablet Take 1 tablet (500 mg total) by mouth daily. 7 tablet 0   predniSONE (DELTASONE) 20 MG tablet Take 2 tabs (40 mg) po qd x 5 d 10 tablet 0   promethazine-dextromethorphan (PROMETHAZINE-DM) 6.25-15 MG/5ML syrup Take 5 mLs by mouth 4 (four) times daily as needed for cough. 118 mL 0   No facility-administered medications prior to visit.    Review of Systems  Constitutional:  Negative for chills, diaphoresis, fever, malaise/fatigue and weight loss.  HENT:  Negative for congestion.   Respiratory:  Negative for cough, hemoptysis, sputum production, shortness of breath and wheezing.   Cardiovascular:  Negative for chest pain, palpitations and leg swelling.    Objective:   There were no vitals filed for this visit.     Physical Exam: General: Well-appearing, no acute distress HENT: [place], AT Eyes: EOMI, no scleral icterus Respiratory: Clear to auscultation bilaterally.  No crackles, wheezing or rales Cardiovascular: RRR, -M/R/G, no JVD  Extremities:-Edema,-tenderness Neuro: AAO x4, CNII-XII grossly intact Psych: Normal mood, normal affect  Data Reviewed:  Imaging: CXR 08/12/21 - No infiltrate, effusion or edema CT A/P 03/15/18 - No parenchymal abnormalities. No infiltrate, effusion or edema  PFT: 09/02/2021 FVC 5.1 (105%) FEV1 3.98 (106%) ratio 77 TLC 100% DLCO 89% Interpretation: Normal PFTs.  No obstructive or restrictive defect present.  No significant bronchodilator response however does not preclude treatment if benefit perceived.  F-V loops with minimal small airway  disease.  Labs: CBC    Component Value Date/Time   WBC 7.2 08/12/2021 1345   WBC 3.2 (L) 03/04/2020 0107   RBC 5.77 08/12/2021 1345   RBC 5.18 03/04/2020 0107   HGB 15.5 08/12/2021 1345   HCT 47.5 08/12/2021 1345   PLT 267 08/12/2021 1345   MCV 82 08/12/2021 1345   MCH 26.9 08/12/2021 1345   MCH 26.8 03/04/2020 0107   MCHC 32.6 08/12/2021 1345   MCHC 31.7 03/04/2020 0107   RDW 13.0 08/12/2021 1345   LYMPHSABS 2.2 11/03/2017 1024   MONOABS 1.2 (H) 01/30/2010 0219   EOSABS 0.3 11/03/2017 1024   BASOSABS 0.0 11/03/2017 1024   Absolute eos 11/03/17 - 300     Assessment & Plan:   Discussion: 51 year old male never smoker with history of COVID-19 in 02/2020 who presents for shortness of breath  51 year old male never smoker with history of COVID in 02/2020 who presents for shortness of breath  COVID-19 long hauler manifesting chronic dyspnea --START Albuterol inhaler TWO puffs AS NEEDED for shortness of breath or wheezing --ARRANGE for pulmonary function tests --If tests are abnormal, we discuss bronchodilators if appropriate  Health Maintenance Immunization History  Administered Date(s) Administered   Influenza-Unspecified 06/03/2018   CT Lung Screen - not qualified  No orders of the defined types were placed in this encounter.  No orders of the defined types were placed in this encounter.   No follow-ups on file. After PFTs  I have spent a total time of***-minutes on the day of the appointment reviewing prior documentation, coordinating care and discussing medical diagnosis and plan with the patient/family. Past medical history, allergies, medications were reviewed. Pertinent imaging, labs and tests included in this note have been reviewed and interpreted independently by me.  [place], MD [facility] 09/09/2021 8:25 AM  Office Number [phone]

## 2021-10-06 ENCOUNTER — Other Ambulatory Visit: Payer: Self-pay

## 2021-10-06 ENCOUNTER — Ambulatory Visit: Payer: BC Managed Care – PPO | Admitting: Pulmonary Disease

## 2021-10-06 ENCOUNTER — Encounter: Payer: Self-pay | Admitting: Pulmonary Disease

## 2021-10-06 VITALS — BP 118/84 | HR 77 | Temp 97.7°F | Ht 72.0 in | Wt 198.8 lb

## 2021-10-06 DIAGNOSIS — U099 Post covid-19 condition, unspecified: Secondary | ICD-10-CM | POA: Diagnosis not present

## 2021-10-06 DIAGNOSIS — R0609 Other forms of dyspnea: Secondary | ICD-10-CM

## 2021-10-06 NOTE — Progress Notes (Signed)
? ? ?Subjective:  ? ?PATIENT ID: Joseph Lozano GENDER: male DOB: [birth year], MRN: [id number] ? ? ?HPI ? ?Chief Complaint  ?Patient presents with  ? Follow-up  ?  Feeling better, all cleared up  ? ? ?Reason for Visit: Follow-up ? ?[name] is a 51 year old male never smoker with history of COVID-19 in 02/2020 who presents for shortness of breath.  ? ?Synopsis: ?History of COVID-19 in 02/2020. Treated with steroids and albuterol. Did not need oxygen. He reports that it took a month before returning to his baseline activity however still had persistent fatigue, shortness of breath, wheezing and coughing and wheezing. In the last six months, he still has fatigue and decreased energy, cough with moderate activity that was never present before. Wheezing resolved but recurs with illnesses. At baseline he runs on the treadmilll 30 minutes every other day and resistance training.  ? ?08/29/21 ?He established care with Dr. Lacinda Axon on 08/11/21. He was seen for sinus congestion and cough for 1.5 weeks. Given Augmentin. He reports after antibiotics, he reports his sinus symptoms improved. However had wheezing which he expresses concern. Pulmonary referral placed. ? ?10/06/2021 ?He reports using his albuterol once a week. Pollen is triggering him but overall he reports his shortness of breath has improved significantly since our last visit. No further wheezing. No limitations in activity. ? ?Social History: ?Never smoker ?Musician - drummer ? ? ?Past Medical History:  ?Diagnosis Date  ? Fractures   ? IBS (irritable bowel syndrome)   ? Kidney stone   ? Pneumonia   ?  ? ?Family History  ?Problem Relation Age of Onset  ? Cancer Mother   ?     Breast, lung  ? Hypertension Maternal Grandmother   ? Heart attack Maternal Grandmother   ? Colon cancer Neg Hx   ?  ? ?Social History  ? ?Occupational History  ? Not on file  ?Tobacco Use  ? Smoking status: Never  ?  Passive exposure: Past  ? Smokeless tobacco: Never  ?Vaping Use  ? Vaping  Use: Never used  ?Substance and Sexual Activity  ? Alcohol use: No  ? Drug use: No  ? Sexual activity: Not on file  ? ? ?Allergies  ?Allergen Reactions  ? Aspirin   ?  Throat closed as a child  ? Nsaids   ?  Tolerates ibu  ? Sulfonamide Derivatives   ?  Muscle cramps and joint pain  ?  ? ?Outpatient Medications Prior to Visit  ?Medication Sig Dispense Refill  ? albuterol (VENTOLIN HFA) 108 (90 Base) MCG/ACT inhaler Inhale 2 puffs into the lungs every 6 (six) hours as needed for wheezing or shortness of breath. 8 g 3  ? promethazine-dextromethorphan (PROMETHAZINE-DM) 6.25-15 MG/5ML syrup Take 5 mLs by mouth 4 (four) times daily as needed for cough. 118 mL 0  ? levofloxacin (LEVAQUIN) 500 MG tablet Take 1 tablet (500 mg total) by mouth daily. 7 tablet 0  ? predniSONE (DELTASONE) 20 MG tablet Take 2 tabs (40 mg) po qd x 5 d 10 tablet 0  ? ?No facility-administered medications prior to visit.  ? ? ?Review of Systems  ?Constitutional:  Negative for chills, diaphoresis, fever, malaise/fatigue and weight loss.  ?HENT:  Negative for congestion.   ?Respiratory:  Negative for cough, hemoptysis, sputum production, shortness of breath and wheezing.   ?Cardiovascular:  Negative for chest pain, palpitations and leg swelling.  ? ? ?Objective:  ? ?Vitals:  ? 10/06/21 0924  ?BP:  118/84  ?Pulse: 77  ?Temp: 97.7 ?F (36.5 ?C)  ?TempSrc: Oral  ?SpO2: 97%  ?Weight: 198 lb 12.8 oz (90.2 kg)  ?Height: 6' (1.829 m)  ? ? ?SpO2: 97 % ?O2 Device: None (Room air) ? ?Physical Exam: ?General: Well-appearing, no acute distress ?HENT: [place], AT ?Eyes: EOMI, no scleral icterus ?Respiratory: Clear to auscultation bilaterally.  No crackles, wheezing or rales ?Cardiovascular: RRR, -M/R/G, no JVD ?Extremities:-Edema,-tenderness ?Neuro: AAO x4, CNII-XII grossly intact ?Psych: Normal mood, normal affect ? ?Data Reviewed: ? ?Imaging: ?CT A/P 03/15/18 - No parenchymal abnormalities. No infiltrate, effusion or edema ?CXR 08/12/21 - No infiltrate, effusion or  edema ? ?PFT: ?09/02/2021 ?FVC 5.1 (105%) FEV1 3.98 (106%) ratio 77 TLC 100% DLCO 89% ?Interpretation: Normal PFTs.  No obstructive or restrictive defect present.  No significant bronchodilator response however does not preclude treatment if benefit perceived.  F-V loops with minimal small airway disease. ? ?Labs: ?CBC ?   ?Component Value Date/Time  ? WBC 7.2 08/12/2021 1345  ? WBC 3.2 (L) 03/04/2020 0107  ? RBC 5.77 08/12/2021 1345  ? RBC 5.18 03/04/2020 0107  ? HGB 15.5 08/12/2021 1345  ? HCT 47.5 08/12/2021 1345  ? PLT 267 08/12/2021 1345  ? MCV 82 08/12/2021 1345  ? MCH 26.9 08/12/2021 1345  ? MCH 26.8 03/04/2020 0107  ? MCHC 32.6 08/12/2021 1345  ? MCHC 31.7 03/04/2020 0107  ? RDW 13.0 08/12/2021 1345  ? LYMPHSABS 2.2 11/03/2017 1024  ? MONOABS 1.2 (H) 01/30/2010 2549  ? EOSABS 0.3 11/03/2017 1024  ? BASOSABS 0.0 11/03/2017 1024  ? ?Absolute eos 11/03/17 - 300 ? ?   ?Assessment & Plan:  ? ?Discussion: ?51 year old male never smoker with history of COVID in 02/2020 who presents for shortness of breath.  Reviewed PFTs.  No significant obstructive or restrictive defect present.  F-V loops with minimal small airway disease.  ? ?He may have asthma-like symptoms that developed after his COVID infection that can result in prolonged symptoms during respiratory illnesses. He may benefit from LABA/ICS in the future but for now his symptoms are well controlled on SABA. ? ?COVID-19 long hauler manifesting chronic dyspnea - improving ?--CONTINUE Albuterol inhaler TWO puffs AS NEEDED for shortness of breath or wheezing ?--CONTINUE Flonase as needed for nasal congestion ?--Consider starting zyrtec or claritin once a day ? ?Health Maintenance ?Immunization History  ?Administered Date(s) Administered  ? Influenza-Unspecified 06/03/2018  ? ?CT Lung Screen - not qualified ? ?No orders of the defined types were placed in this encounter. ? ?No orders of the defined types were placed in this encounter. ? ? ?Return if symptoms worsen or  fail to improve.  ? ?I have spent a total time of 32-minutes on the day of the appointment reviewing prior documentation, coordinating care and discussing medical diagnosis and plan with the patient/family. Past medical history, allergies, medications were reviewed. Pertinent imaging, labs and tests included in this note have been reviewed and interpreted independently by me. ? ?Edoardo Laforte Rodman Pickle, MD ?[facility] ?10/06/2021 10:56 AM  ?Office Number [phone] ? ? ?

## 2021-10-06 NOTE — Patient Instructions (Signed)
?  COVID-19 long hauler manifesting chronic dyspnea - improving ?--CONTINUE Albuterol inhaler TWO puffs AS NEEDED for shortness of breath or wheezing ?--CONTINUE Flonase as needed for nasal congestion ?--Consider starting zyrtec or claritin once a day ? ?Follow-up with me as needed ?

## 2022-03-05 DIAGNOSIS — M25521 Pain in right elbow: Secondary | ICD-10-CM | POA: Diagnosis not present

## 2022-03-05 DIAGNOSIS — M25511 Pain in right shoulder: Secondary | ICD-10-CM | POA: Diagnosis not present

## 2022-06-17 IMAGING — DX DG CHEST 2V
2 series · 2 of 2 positions shown · non-contrast
Comparison: None.

CLINICAL DATA: Chest pain

EXAM:
CHEST - 2 VIEW

[chest pa]
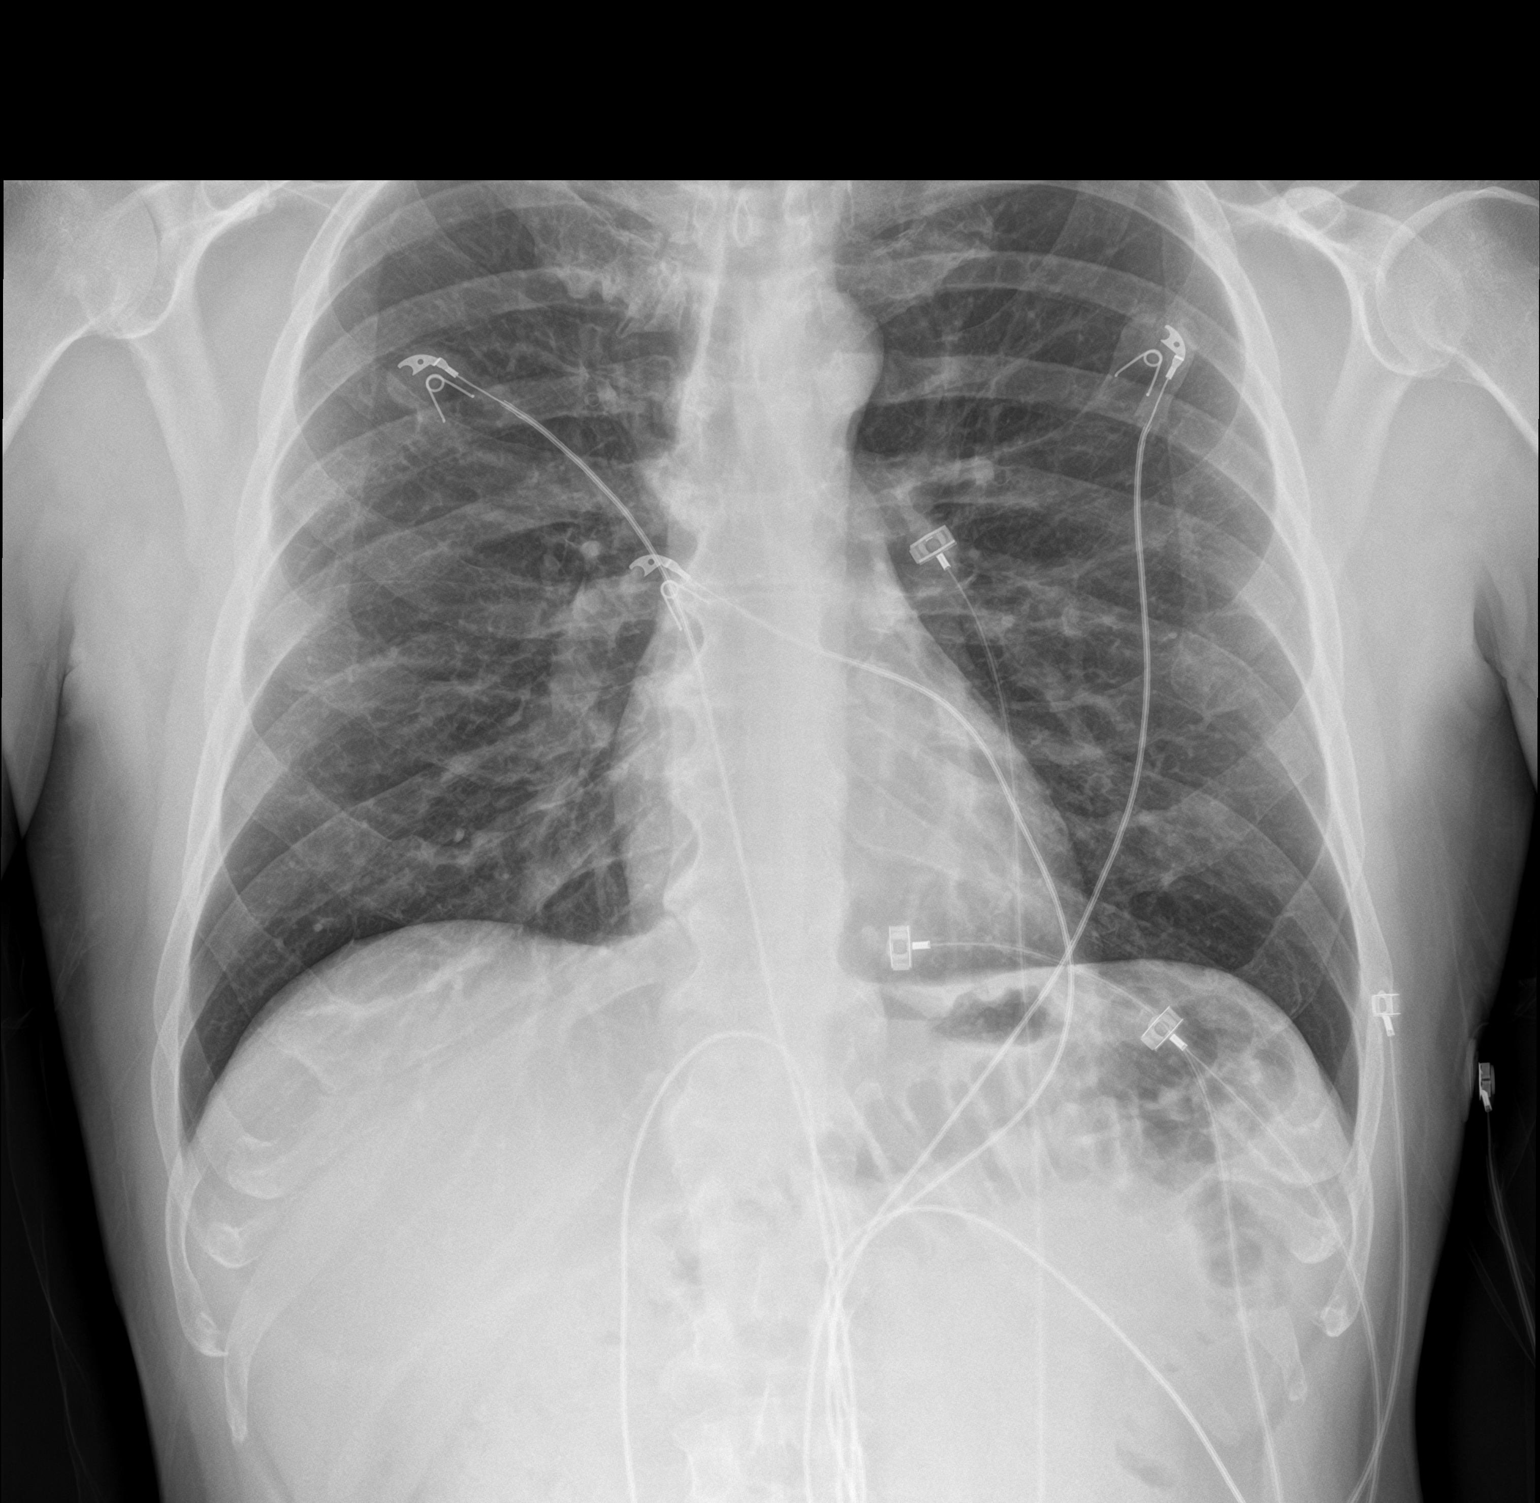

[chest lat]
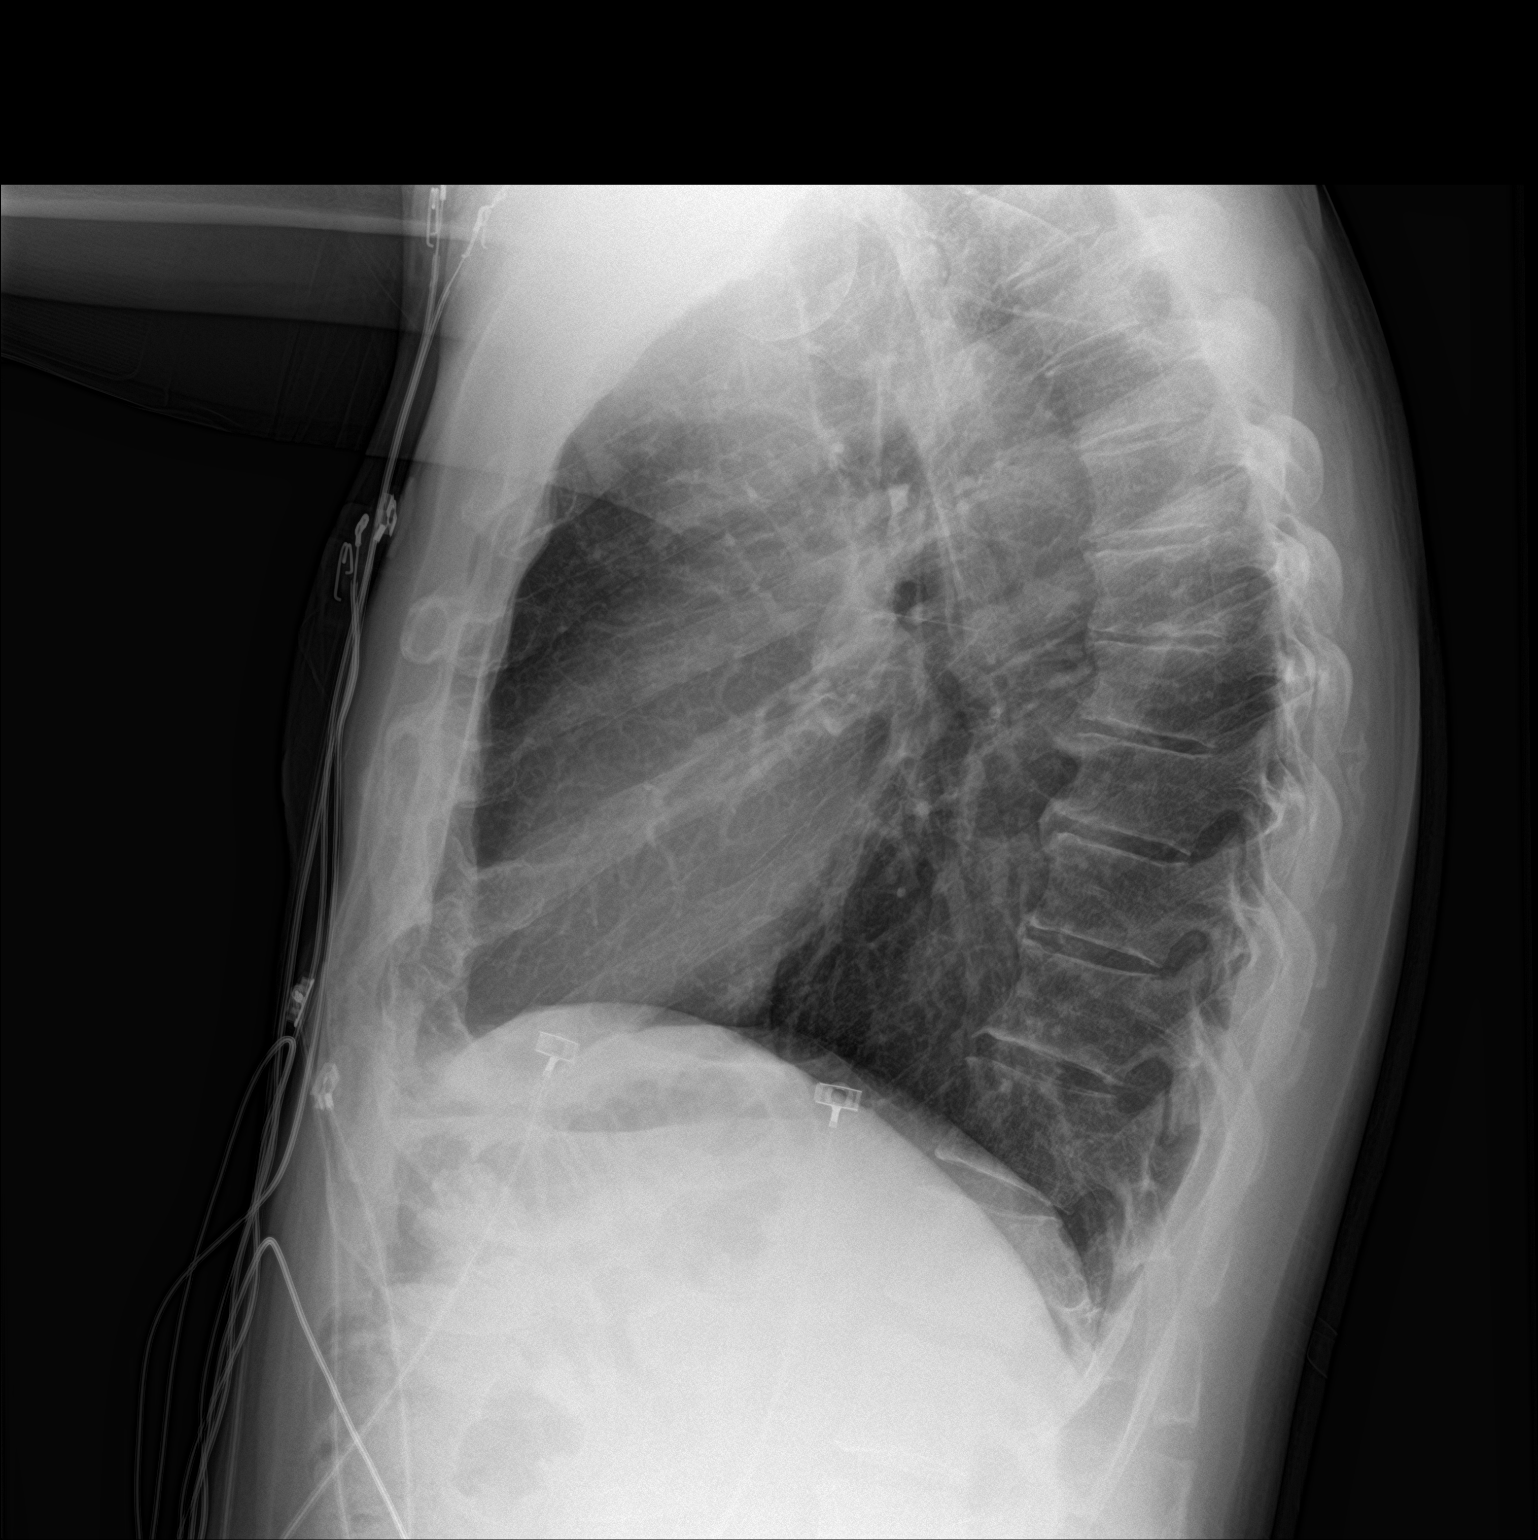

[2 of 2 positions shown; findings below may reference images not displayed]

FINDINGS: The heart size and mediastinal contours are within normal limits.
Mildly increased peribronchial markings seen within the perihilar
region with streaky opacity at the right lung base. No pleural
effusion or large airspace consolidation. The visualized skeletal
structures are unremarkable.
IMPRESSION: Findings which could be suggestive of mild bronchitis or reactive
airway disease.

## 2022-07-15 DIAGNOSIS — M25551 Pain in right hip: Secondary | ICD-10-CM | POA: Diagnosis not present

## 2022-07-15 DIAGNOSIS — M25561 Pain in right knee: Secondary | ICD-10-CM | POA: Diagnosis not present

## 2023-08-12 DIAGNOSIS — M545 Low back pain, unspecified: Secondary | ICD-10-CM | POA: Diagnosis not present

## 2023-08-12 DIAGNOSIS — M5416 Radiculopathy, lumbar region: Secondary | ICD-10-CM | POA: Diagnosis not present

## 2023-08-20 DIAGNOSIS — M5416 Radiculopathy, lumbar region: Secondary | ICD-10-CM | POA: Diagnosis not present

## 2023-08-20 DIAGNOSIS — M47816 Spondylosis without myelopathy or radiculopathy, lumbar region: Secondary | ICD-10-CM | POA: Diagnosis not present

## 2023-09-14 DIAGNOSIS — M545 Low back pain, unspecified: Secondary | ICD-10-CM | POA: Diagnosis not present

## 2023-09-21 DIAGNOSIS — M5416 Radiculopathy, lumbar region: Secondary | ICD-10-CM | POA: Diagnosis not present

## 2023-09-21 DIAGNOSIS — M47896 Other spondylosis, lumbar region: Secondary | ICD-10-CM | POA: Diagnosis not present

## 2023-11-25 IMAGING — CR DG CHEST 2V
2 series · 2 of 2 positions shown · non-contrast
Comparison: 03/04/2020.

CLINICAL DATA: Chronic ROX

EXAM:
CHEST - 2 VIEW

[w pa chest]
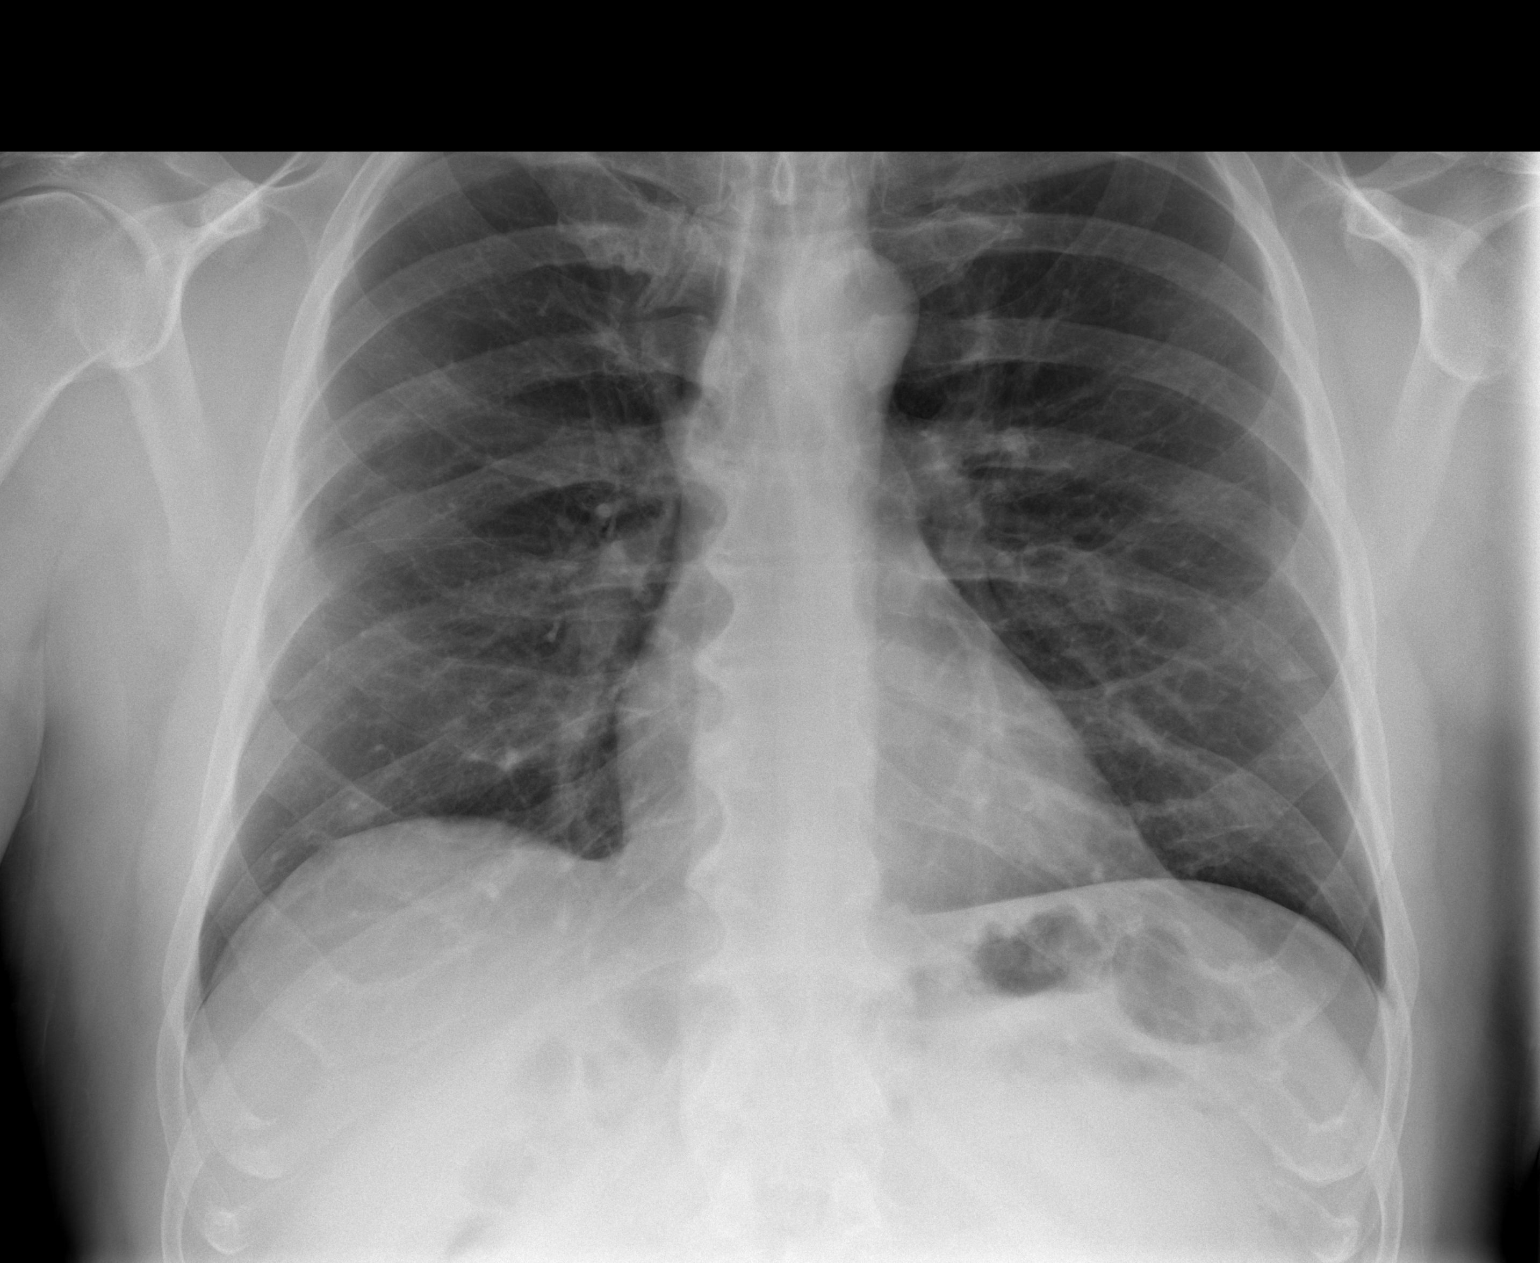

[w chest lat]
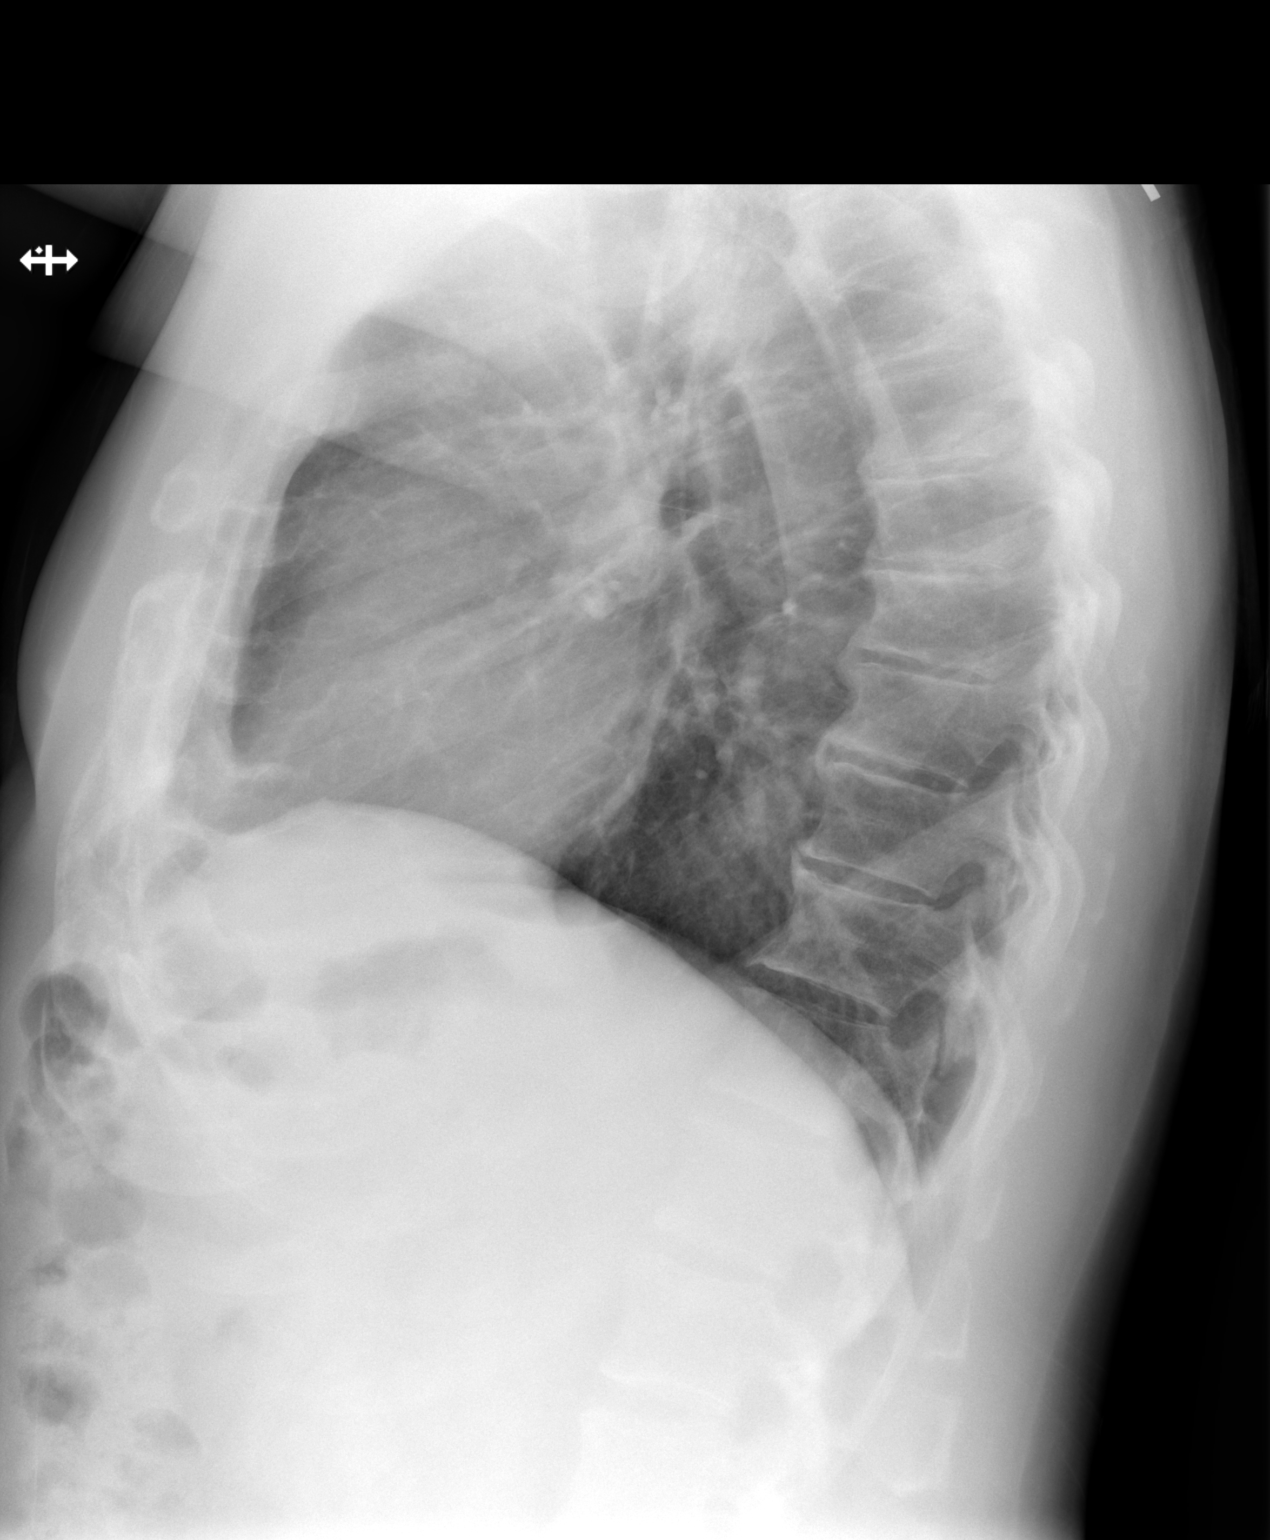

[2 of 2 positions shown; findings below may reference images not displayed]

FINDINGS: No consolidation. No visible pleural effusions or pneumothorax.
Cardiomediastinal silhouette is within normal limits.
IMPRESSION: No evidence of acute cardiopulmonary disease.

## 2023-12-09 ENCOUNTER — Encounter (INDEPENDENT_AMBULATORY_CARE_PROVIDER_SITE_OTHER): Payer: Self-pay | Admitting: *Deleted

## 2023-12-09 ENCOUNTER — Ambulatory Visit (INDEPENDENT_AMBULATORY_CARE_PROVIDER_SITE_OTHER)

## 2023-12-09 VITALS — BP 114/84 | HR 72 | Ht 70.0 in | Wt 203.0 lb

## 2023-12-09 DIAGNOSIS — Z1211 Encounter for screening for malignant neoplasm of colon: Secondary | ICD-10-CM

## 2023-12-09 DIAGNOSIS — Z0000 Encounter for general adult medical examination without abnormal findings: Secondary | ICD-10-CM | POA: Diagnosis not present

## 2023-12-09 DIAGNOSIS — Z136 Encounter for screening for cardiovascular disorders: Secondary | ICD-10-CM

## 2023-12-09 DIAGNOSIS — Z125 Encounter for screening for malignant neoplasm of prostate: Secondary | ICD-10-CM | POA: Diagnosis not present

## 2023-12-09 NOTE — Progress Notes (Signed)
 New Patient Office Visit  Subjective    Patient ID: Joseph Lozano, male    DOB: 1971-05-08  Age: 53 y.o. MRN: [id number]  CC:  Chief Complaint  Patient presents with   Establish Care    Pt states here to establish care/ CPE     HPI Joseph Lozano presents to establish care Physical exam  Outpatient Encounter Medications as of 12/09/2023  Medication Sig   albuterol  (VENTOLIN  HFA) 108 (90 Base) MCG/ACT inhaler Inhale 2 puffs into the lungs every 6 (six) hours as needed for wheezing or shortness of breath.   [DISCONTINUED] promethazine -dextromethorphan (PROMETHAZINE -DM) 6.25-15 MG/5ML syrup Take 5 mLs by mouth 4 (four) times daily as needed for cough. (Patient not taking: Reported on 12/09/2023)   No facility-administered encounter medications on file as of 12/09/2023.    Past Medical History:  Diagnosis Date   Allergy    Fractures    IBS (irritable bowel syndrome)    Kidney stone    Pneumonia     Past Surgical History:  Procedure Laterality Date   COLONOSCOPY  2009   Dr. fields: Normal   COLONOSCOPY N/A 05/23/2018   Procedure: COLONOSCOPY;  Surgeon: Alyce Jubilee, MD;  [facility];  Service: Endoscopy;  Laterality: N/A;  2:15pm - office unable to reach pt to move up   POLYPECTOMY  05/23/2018   Procedure: POLYPECTOMY;  Surgeon: Alyce Jubilee, MD;  Location: [facility];  Service: Endoscopy;;  ascending colon polyp   TONSILLECTOMY      Family History  Problem Relation Age of Onset   Cancer Mother        Breast, lung   Stroke Father    Hypertension Maternal Grandmother    Heart attack Maternal Grandmother    Colon cancer Neg Hx     Social History   Socioeconomic History   Marital status: Married    Spouse name: Not on file   Number of children: Not on file   Years of education: Not on file   Highest education level: Not on file  Occupational History   Not on file  Tobacco Use   Smoking status: Never    Passive exposure: Past   Smokeless  tobacco: Never  Vaping Use   Vaping status: Never Used  Substance and Sexual Activity   Alcohol use: No   Drug use: No   Sexual activity: Yes  Other Topics Concern   Not on file  Social History Narrative   Not on file   Social Drivers of Health   Financial Resource Strain: Not on file  Food Insecurity: No Food Insecurity (12/09/2023)   Hunger Vital Sign    Worried About Running Out of Food in the Last Year: Never true    Ran Out of Food in the Last Year: Never true  Transportation Needs: No Transportation Needs (12/09/2023)   PRAPARE - Administrator, Civil Service (Medical): No    Lack of Transportation (Non-Medical): No  Physical Activity: Not on file  Stress: Not on file  Social Connections: Not on file  Intimate Partner Violence: Not At Risk (12/09/2023)   Humiliation, Afraid, Rape, and Kick questionnaire    Fear of Current or Ex-Partner: No    Emotionally Abused: No    Physically Abused: No    Sexually Abused: No    Review of Systems  Constitutional: Negative.   HENT: Negative.    Eyes: Negative.   Respiratory:  Positive for shortness of  breath (since having Covid in 2021).   Cardiovascular: Negative.   Gastrointestinal: Negative.   Genitourinary: Negative.   Musculoskeletal: Negative.   Skin: Negative.   Neurological: Negative.   Psychiatric/Behavioral: Negative.       Objective    BP 114/84   Pulse 72   Ht 5\' 10"  (1.778 m)   Wt 203 lb (92.1 kg)   SpO2 97%   BMI 29.13 kg/m   Physical Exam Vitals and nursing note reviewed.  Constitutional:      Appearance: Normal appearance.  HENT:     Head: Normocephalic.     Right Ear: Tympanic membrane, ear canal and external ear normal.     Left Ear: Tympanic membrane, ear canal and external ear normal.     Nose: Nose normal.     Mouth/Throat:     Mouth: Mucous membranes are moist.     Pharynx: Oropharynx is clear.  Eyes:     Extraocular Movements: Extraocular movements intact.     Pupils: Pupils  are equal, round, and reactive to light.  Cardiovascular:     Rate and Rhythm: Normal rate and regular rhythm.  Pulmonary:     Effort: Pulmonary effort is normal.     Breath sounds: Normal breath sounds.  Musculoskeletal:        General: Normal range of motion.     Cervical back: Normal range of motion and neck supple.  Skin:    General: Skin is warm and dry.  Neurological:     Mental Status: He is alert and oriented to person, place, and time.  Psychiatric:        Mood and Affect: Mood normal.        Thought Content: Thought content normal.         Assessment & Plan:   Problem List Items Addressed This Visit   None Visit Diagnoses       Encounter for preventative adult health care examination    -  Primary   unremarkable exam.  check fasting labs today.  recommend weight reduction with a healthy diet and regular exercise.   Relevant Orders   CMP14+EGFR   Lipid Profile   HgB A1c   TSH + free T4     Prostate cancer screening       add for screening purposes   Relevant Orders   PSA (Completed)     Screening for colon cancer       add for screening   Relevant Orders   Ambulatory referral to Gastroenterology     Encounter for screening for cardiovascular disorders       recommend obtaining CT calcium score given reports of ongoing SOB.   Relevant Orders   CT CARDIAC SCORING       No follow-ups on file.   Alison Irvine, FNP

## 2023-12-10 LAB — PSA: Prostate Specific Ag, Serum: 1 ng/mL (ref 0.0–4.0)

## 2023-12-21 ENCOUNTER — Ambulatory Visit (HOSPITAL_COMMUNITY)
Admission: RE | Admit: 2023-12-21 | Discharge: 2023-12-21 | Disposition: A | Discharge status: Home / Self Care | Payer: Self-pay | Source: Ambulatory Visit

## 2023-12-21 DIAGNOSIS — Z136 Encounter for screening for cardiovascular disorders: Secondary | ICD-10-CM | POA: Insufficient documentation

## 2023-12-29 ENCOUNTER — Ambulatory Visit: Payer: Self-pay

## 2024-01-05 ENCOUNTER — Other Ambulatory Visit: Payer: Self-pay

## 2024-01-05 ENCOUNTER — Ambulatory Visit: Payer: Self-pay

## 2024-01-05 MED ORDER — PSEUDOEPH-BROMPHEN-DM 30-2-10 MG/5ML PO SYRP: 5.0000 mL | ORAL_SOLUTION | Freq: Three times a day (TID) | ORAL | 0 refills | Status: DC | PRN

## 2024-01-05 MED ORDER — METHYLPREDNISOLONE 4 MG PO TBPK: ORAL_TABLET | ORAL | 0 refills | Status: DC

## 2024-01-05 NOTE — Telephone Encounter (Signed)
    FYI Only or Action Required?: FYI only for provider  Patient was last seen in primary care on 12/09/23. Called Nurse Triage reporting Sinus Problems.. Symptoms began a week ago. Interventions attempted: OTC-Nyquil and Zyrtec. Symptoms are: unchanged.  Triage Disposition: See HCP Within 4 Hours (Or PCP Triage)- Local Urgent Care   Patient/caregiver understands and will follow disposition?: Yes          ------------------------------------------  Copied From CRM [id number]. Reason for Triage: Patient has been out of town and coming back, he feels he may have a sinus infection. He has symptoms of congestion, both ears are clogged almost like water is present, stuffy nose, no fever, no stomach issues. This started Friday. Tried numerous OTCs but nothing's working. Wondering if the doctor can send something to the pharmacy. Cb# [id number]     Reason for Disposition  [1] Redness or swelling on the cheek, forehead or around the eye AND [2] no fever  Answer Assessment - Initial Assessment Questions 1. LOCATION: "Where does it hurt?"      --- Head, ears, areas around cheeks     2. ONSET: "When did the sinus pain start?"  (e.g., hours, days)      ------ Last Thursday 5/29    3. SEVERITY: "How bad is the pain?"   (Scale 1-10; mild, moderate or severe)   - MILD (1-3): doesn't interfere with normal activities    - MODERATE (4-7): interferes with normal activities (e.g., work or school) or awakens from sleep   - SEVERE (8-10): excruciating pain and patient unable to do any normal activities         -----------5/10      4. RECURRENT SYMPTOM: "Have you ever had sinus problems before?" If Yes, ask: "When was the last time?" and "What happened that time?"      ----------   5. NASAL CONGESTION: "Is the nose blocked?" If Yes, ask: "Can you open it or must you breathe through your mouth?"     ------- Yes    6. NASAL DISCHARGE: "Do you have discharge from your nose?" If so ask,  "What color?"     -------- It did, but dissipated   7. FEVER: "Do you have a fever?" If Yes, ask: "What is it, how was it measured, and when did it start?"  ----------Denies    8. OTHER SYMPTOMS: "Do you have any other symptoms?" (e.g., sore throat, cough, earache, difficulty breathing) --------- Irritated Throat  ----------Cough-Productive: Yellow  ---------- Tearful eyes     -----Additional Information: Tried interventions to unclog his muffled ears: yawning, blowing his nose OTC: Nyquil and Zyrtec- Mild Relief.  Protocols used: Sinus Pain or Congestion-A-AH

## 2024-01-20 ENCOUNTER — Telehealth: Payer: Self-pay | Admitting: Internal Medicine

## 2024-01-20 NOTE — Telephone Encounter (Signed)
 Received the colonoscopy questionnaire and left a message for the patient to call back to make an OV appt first

## 2024-01-31 NOTE — H&P (View-Only) (Signed)
 GI Office Note    Referring Provider: Bevely Doffing, FNP Primary Care Physician:  Bevely Doffing, FNP  Primary Gastroenterologist: Carlin POUR. Cindie, DO   Chief Complaint   Chief Complaint  Patient presents with   Colonoscopy    Has issues with intermittent upper right quadrant pain/bulging.      History of Present Illness   Joseph Lozano is a 53 y.o. male presenting today at the request of Doffing Bevely, FNP for screening colonoscopy.  He completed our questionnaire for colonoscopy. Marked change in bowel habits and new onset constipation. He was offered an ov for evaluation.  Today: Patient seen back in 2019 when he presented with right-sided abdominal pain, at that time seem to be reproduced with movement, less related to bowel function.  CT was unremarkable except for possible punctate right renal stone.  Abdominal ultrasound with unremarkable gallbladder at that time as well. States he has been told he likely has element of IBS.  Travels a lot, plays drums in a band.  For the past couple years feels like his symptoms are progressively gotten worse.  Has noted change in bowel function.  Although still having some BM couple times a day, feels like stools are less productive, incomplete.  Notes increased right upper abdominal bloating and discomfort.  Abdominal pain often worse with meals but also happens without meals.  Seems to be worse with incomplete stools.  Feels swollen in the right upper abdomen.  Also notes early satiety.  Feels full all the time.  No significant heartburn.  Some occasional solid food dysphagia.  He is not sure if this is because he eats too fast at times.  He denies any melena or rectal bleeding.  He uses ibuprofen  only occasionally with history of chronic neck and back issues.  Continues daily probiotics without relief.  Also notes bulging in the center of his upper abdomen especially when he goes to sit up.  Sometimes has some moderate discomfort  related.   Mother had a history of ruptured gallbladder.  Colonoscopy 05/2018: -4mm tubular adenoma removed from mid ascending colon -few diverticula in recto-sigmoid and sigmoid colon -internal hemorrhoids -next colonoscopy between 2022-2024.     Medications   Current Outpatient Medications  Medication Sig Dispense Refill   albuterol  (VENTOLIN  HFA) 108 (90 Base) MCG/ACT inhaler Inhale 2 puffs into the lungs every 6 (six) hours as needed for wheezing or shortness of breath. 8 g 3   Lactobacillus (PROBIOTIC ACIDOPHILUS PO) Take by mouth.     No current facility-administered medications for this visit.    Allergies   Allergies as of 02/01/2024 - Review Complete 02/01/2024  Allergen Reaction Noted   Aspirin  04/19/2009   Nsaids  10/28/2012   Sulfonamide derivatives  04/19/2009    Past Medical History   Past Medical History:  Diagnosis Date   Allergy    Fractures    IBS (irritable bowel syndrome)    Kidney stone    Pneumonia    covid    Past Surgical History   Past Surgical History:  Procedure Laterality Date   COLONOSCOPY  2009   Dr. harvey: Normal   COLONOSCOPY N/A 05/23/2018   Procedure: COLONOSCOPY;  Surgeon: harvey Margo CROME, MD;  [facility];  Service: Endoscopy;  Laterality: N/A;  2:15pm - office unable to reach pt to move up   POLYPECTOMY  05/23/2018   Procedure: POLYPECTOMY;  Surgeon: harvey Margo CROME, MD;  [facility]: [facility];  Service: Endoscopy;;  ascending colon polyp   TONSILLECTOMY      Past Family History   Family History  Problem Relation Age of Onset   Cancer Mother        Breast, lung   Stroke Father    Hypertension Maternal Grandmother    Heart attack Maternal Grandmother    Colon cancer Neg Hx     Past Social History   Social History   Socioeconomic History   Marital status: Married    Spouse name: Not on file   Number of children: Not on file   Years of education: Not on file   Highest education level: Not on  file  Occupational History   Not on file  Tobacco Use   Smoking status: Never    Passive exposure: Past   Smokeless tobacco: Never  Vaping Use   Vaping status: Never Used  Substance and Sexual Activity   Alcohol use: No   Drug use: No   Sexual activity: Yes  Other Topics Concern   Not on file  Social History Narrative   Not on file   Social Drivers of Health   Financial Resource Strain: Not on file  Food Insecurity: No Food Insecurity (12/09/2023)   Hunger Vital Sign    Worried About Running Out of Food in the Last Year: Never true    Ran Out of Food in the Last Year: Never true  Transportation Needs: No Transportation Needs (12/09/2023)   PRAPARE - Administrator, Civil Service (Medical): No    Lack of Transportation (Non-Medical): No  Physical Activity: Not on file  Stress: Not on file  Social Connections: Not on file  Intimate Partner Violence: Not At Risk (12/09/2023)   Humiliation, Afraid, Rape, and Kick questionnaire    Fear of Current or Ex-Partner: No    Emotionally Abused: No    Physically Abused: No    Sexually Abused: No    Review of Systems   General: Negative for anorexia, weight loss, fever, chills, fatigue, weakness. Eyes: Negative for vision changes.  ENT: Negative for hoarseness,  nasal congestion.  See HPI CV: Negative for chest pain, angina, palpitations, dyspnea on exertion, peripheral edema.  Respiratory: Negative for dyspnea at rest, dyspnea on exertion, cough, sputum, wheezing.  GI: See history of present illness. GU:  Negative for dysuria, hematuria, urinary incontinence, urinary frequency, nocturnal urination.  MS: Negative for joint pain.  Chronic intermittent back and neck pain.  Derm: Negative for rash or itching.  Neuro: Negative for weakness, abnormal sensation, seizure, frequent headaches, memory loss,  confusion.  Psych: Negative for anxiety, depression, suicidal ideation, hallucinations.  Endo: Negative for unusual weight  change.  Heme: Negative for bruising or bleeding. Allergy: Negative for rash or hives.  Physical Exam   BP 118/78 (BP Location: Right Arm, Patient Position: Sitting, Cuff Size: Normal)   Pulse 65   Temp 98.1 F (36.7 C) (Oral)   Ht 5' 10 (1.778 m)   Wt 202 lb 3.2 oz (91.7 kg)   SpO2 98%   BMI 29.01 kg/m    General: Well-nourished, well-developed in no acute distress.  Head: Normocephalic, atraumatic.   Eyes: Conjunctiva pink, no icterus. Mouth: Oropharyngeal mucosa moist and pink  Neck: Supple without thyromegaly, masses, or lymphadenopathy.  Lungs: Clear to auscultation bilaterally.  Heart: Regular rate and rhythm, no murmurs rubs or gallops.  Abdomen: Bowel sounds are normal, nondistended, no hepatosplenomegaly or masses,  no abdominal bruits or hernia, no rebound  or guarding.  Rectus diastases.  Tender in the epigastric/right upper quadrant Rectal: not performed Extremities: No lower extremity edema. No clubbing or deformities.  Neuro: Alert and oriented x 4 , grossly normal neurologically.  Skin: Warm and dry, no rash or jaundice.   Psych: Alert and cooperative, normal mood and affect.  Labs   Labs from May 2025: PSA 1 Labs from October 2023: Creatinine 0.92, albumin 4.9, total bilirubin 0.7, alk phos 71, AST 22, ALT 23, hemoglobin 15.5, platelets 267,000  Imaging Studies   No results found.  Assessment/Plan:   History of adenomatous colon polyp: -due surveillance colonoscopy  Change in bowel function: -reports baseline IBS, travels a lot -having daily stools but less productive -add benefiber 2 teaspoons daily for one week then increase to heaping tablespoon daily -colonoscopy as outlined, for surveillance purposes  Chronic right upper quadrant abdominal pain/epigastric pain/early satiety/occasional dysphagia: -etiology not clear, differential includes gastritis, PUD, GERD, biliary -plan for upper endoscopy with possible esophageal dilation.    Colonoscopy/EGD/ED with Dr. Cindie. ASA 2.  I have discussed the risks, alternatives, benefits with regards to but not limited to the risk of reaction to medication, bleeding, infection, perforation and the patient is agreeable to proceed. Written consent to be obtained.    [name], MHS, PA-C [facility]

## 2024-01-31 NOTE — Progress Notes (Signed)
 GI Office Note    Referring Provider: Bevely Doffing, FNP Primary Care Physician:  Bevely Doffing, FNP  Primary Gastroenterologist: Carlin POUR. Cindie, DO   Chief Complaint   Chief Complaint  Patient presents with   Colonoscopy    Has issues with intermittent upper right quadrant pain/bulging.      History of Present Illness   Joseph Lozano is a 53 y.o. male presenting today at the request of Doffing Bevely, FNP for screening colonoscopy.  He completed our questionnaire for colonoscopy. Marked change in bowel habits and new onset constipation. He was offered an ov for evaluation.  Today: Patient seen back in 2019 when he presented with right-sided abdominal pain, at that time seem to be reproduced with movement, less related to bowel function.  CT was unremarkable except for possible punctate right renal stone.  Abdominal ultrasound with unremarkable gallbladder at that time as well. States he has been told he likely has element of IBS.  Travels a lot, plays drums in a band.  For the past couple years feels like his symptoms are progressively gotten worse.  Has noted change in bowel function.  Although still having some BM couple times a day, feels like stools are less productive, incomplete.  Notes increased right upper abdominal bloating and discomfort.  Abdominal pain often worse with meals but also happens without meals.  Seems to be worse with incomplete stools.  Feels swollen in the right upper abdomen.  Also notes early satiety.  Feels full all the time.  No significant heartburn.  Some occasional solid food dysphagia.  He is not sure if this is because he eats too fast at times.  He denies any melena or rectal bleeding.  He uses ibuprofen  only occasionally with history of chronic neck and back issues.  Continues daily probiotics without relief.  Also notes bulging in the center of his upper abdomen especially when he goes to sit up.  Sometimes has some moderate discomfort  related.   Mother had a history of ruptured gallbladder.  Colonoscopy 05/2018: -4mm tubular adenoma removed from mid ascending colon -few diverticula in recto-sigmoid and sigmoid colon -internal hemorrhoids -next colonoscopy between 2022-2024.     Medications   Current Outpatient Medications  Medication Sig Dispense Refill   albuterol  (VENTOLIN  HFA) 108 (90 Base) MCG/ACT inhaler Inhale 2 puffs into the lungs every 6 (six) hours as needed for wheezing or shortness of breath. 8 g 3   Lactobacillus (PROBIOTIC ACIDOPHILUS PO) Take by mouth.     No current facility-administered medications for this visit.    Allergies   Allergies as of 02/01/2024 - Review Complete 02/01/2024  Allergen Reaction Noted   Aspirin  04/19/2009   Nsaids  10/28/2012   Sulfonamide derivatives  04/19/2009    Past Medical History   Past Medical History:  Diagnosis Date   Allergy    Fractures    IBS (irritable bowel syndrome)    Kidney stone    Pneumonia    covid    Past Surgical History   Past Surgical History:  Procedure Laterality Date   COLONOSCOPY  2009   Dr. harvey: Normal   COLONOSCOPY N/A 05/23/2018   Procedure: COLONOSCOPY;  Surgeon: harvey Margo CROME, MD;  Location: [facility];  Service: Endoscopy;  Laterality: N/A;  2:15pm - office unable to reach pt to move up   POLYPECTOMY  05/23/2018   Procedure: POLYPECTOMY;  Surgeon: harvey Margo CROME, MD;  Location: [facility];  Service: Endoscopy;;  ascending colon polyp   TONSILLECTOMY      Past Family History   Family History  Problem Relation Age of Onset   Cancer Mother        Breast, lung   Stroke Father    Hypertension Maternal Grandmother    Heart attack Maternal Grandmother    Colon cancer Neg Hx     Past Social History   Social History   Socioeconomic History   Marital status: Married    Spouse name: Not on file   Number of children: Not on file   Years of education: Not on file   Highest education level: Not on  file  Occupational History   Not on file  Tobacco Use   Smoking status: Never    Passive exposure: Past   Smokeless tobacco: Never  Vaping Use   Vaping status: Never Used  Substance and Sexual Activity   Alcohol use: No   Drug use: No   Sexual activity: Yes  Other Topics Concern   Not on file  Social History Narrative   Not on file   Social Drivers of Health   Financial Resource Strain: Not on file  Food Insecurity: No Food Insecurity (12/09/2023)   Hunger Vital Sign    Worried About Running Out of Food in the Last Year: Never true    Ran Out of Food in the Last Year: Never true  Transportation Needs: No Transportation Needs (12/09/2023)   PRAPARE - Administrator, Civil Service (Medical): No    Lack of Transportation (Non-Medical): No  Physical Activity: Not on file  Stress: Not on file  Social Connections: Not on file  Intimate Partner Violence: Not At Risk (12/09/2023)   Humiliation, Afraid, Rape, and Kick questionnaire    Fear of Current or Ex-Partner: No    Emotionally Abused: No    Physically Abused: No    Sexually Abused: No    Review of Systems   General: Negative for anorexia, weight loss, fever, chills, fatigue, weakness. Eyes: Negative for vision changes.  ENT: Negative for hoarseness,  nasal congestion.  See HPI CV: Negative for chest pain, angina, palpitations, dyspnea on exertion, peripheral edema.  Respiratory: Negative for dyspnea at rest, dyspnea on exertion, cough, sputum, wheezing.  GI: See history of present illness. GU:  Negative for dysuria, hematuria, urinary incontinence, urinary frequency, nocturnal urination.  MS: Negative for joint pain.  Chronic intermittent back and neck pain.  Derm: Negative for rash or itching.  Neuro: Negative for weakness, abnormal sensation, seizure, frequent headaches, memory loss,  confusion.  Psych: Negative for anxiety, depression, suicidal ideation, hallucinations.  Endo: Negative for unusual weight  change.  Heme: Negative for bruising or bleeding. Allergy: Negative for rash or hives.  Physical Exam   BP 118/78 (BP Location: Right Arm, Patient Position: Sitting, Cuff Size: Normal)   Pulse 65   Temp 98.1 F (36.7 C) (Oral)   Ht 5' 10 (1.778 m)   Wt 202 lb 3.2 oz (91.7 kg)   SpO2 98%   BMI 29.01 kg/m    General: Well-nourished, well-developed in no acute distress.  Head: Normocephalic, atraumatic.   Eyes: Conjunctiva pink, no icterus. Mouth: Oropharyngeal mucosa moist and pink  Neck: Supple without thyromegaly, masses, or lymphadenopathy.  Lungs: Clear to auscultation bilaterally.  Heart: Regular rate and rhythm, no murmurs rubs or gallops.  Abdomen: Bowel sounds are normal, nondistended, no hepatosplenomegaly or masses,  no abdominal bruits or hernia, no rebound  or guarding.  Rectus diastases.  Tender in the epigastric/right upper quadrant Rectal: not performed Extremities: No lower extremity edema. No clubbing or deformities.  Neuro: Alert and oriented x 4 , grossly normal neurologically.  Skin: Warm and dry, no rash or jaundice.   Psych: Alert and cooperative, normal mood and affect.  Labs   Labs from May 2025: PSA 1 Labs from October 2023: Creatinine 0.92, albumin 4.9, total bilirubin 0.7, alk phos 71, AST 22, ALT 23, hemoglobin 15.5, platelets 267,000  Imaging Studies   No results found.  Assessment/Plan:   History of adenomatous colon polyp: -due surveillance colonoscopy  Change in bowel function: -reports baseline IBS, travels a lot -having daily stools but less productive -add benefiber 2 teaspoons daily for one week then increase to heaping tablespoon daily -colonoscopy as outlined, for surveillance purposes  Chronic right upper quadrant abdominal pain/epigastric pain/early satiety/occasional dysphagia: -etiology not clear, differential includes gastritis, PUD, GERD, biliary -plan for upper endoscopy with possible esophageal dilation.    Colonoscopy/EGD/ED with Dr. Cindie. ASA 2.  I have discussed the risks, alternatives, benefits with regards to but not limited to the risk of reaction to medication, bleeding, infection, perforation and the patient is agreeable to proceed. Written consent to be obtained.    Joseph Lozano, MHS, PA-C [facility]

## 2024-02-01 ENCOUNTER — Ambulatory Visit (INDEPENDENT_AMBULATORY_CARE_PROVIDER_SITE_OTHER): Admitting: Gastroenterology

## 2024-02-01 ENCOUNTER — Encounter: Payer: Self-pay | Admitting: Gastroenterology

## 2024-02-01 ENCOUNTER — Encounter: Payer: Self-pay | Admitting: *Deleted

## 2024-02-01 VITALS — BP 118/78 | HR 65 | Temp 98.1°F | Ht 70.0 in | Wt 202.2 lb

## 2024-02-01 DIAGNOSIS — R194 Change in bowel habit: Secondary | ICD-10-CM | POA: Diagnosis not present

## 2024-02-01 DIAGNOSIS — R1013 Epigastric pain: Secondary | ICD-10-CM

## 2024-02-01 DIAGNOSIS — R1011 Right upper quadrant pain: Secondary | ICD-10-CM | POA: Diagnosis not present

## 2024-02-01 DIAGNOSIS — R6881 Early satiety: Secondary | ICD-10-CM | POA: Diagnosis not present

## 2024-02-01 DIAGNOSIS — Z860101 Personal history of adenomatous and serrated colon polyps: Secondary | ICD-10-CM

## 2024-02-01 DIAGNOSIS — R198 Other specified symptoms and signs involving the digestive system and abdomen: Secondary | ICD-10-CM | POA: Insufficient documentation

## 2024-02-01 DIAGNOSIS — M6208 Separation of muscle (nontraumatic), other site: Secondary | ICD-10-CM | POA: Insufficient documentation

## 2024-02-01 DIAGNOSIS — R1310 Dysphagia, unspecified: Secondary | ICD-10-CM

## 2024-02-01 NOTE — Patient Instructions (Addendum)
 Colonoscopy and upper endoscopy to be scheduled with Dr. Cindie.   You may need additional work up of your right upper abdominal pain pending results of your procedures, if no explanation found.  Consider addition of benefiber 2 teaspoons daily for one week and then move to one heaping tablespoon daily. This should help improve bowel function.

## 2024-02-03 ENCOUNTER — Other Ambulatory Visit: Payer: Self-pay | Admitting: *Deleted

## 2024-02-03 ENCOUNTER — Encounter: Payer: Self-pay | Admitting: *Deleted

## 2024-02-03 MED ORDER — PEG 3350-KCL-NA BICARB-NACL 420 G PO SOLR
4000.0000 mL | Freq: Once | ORAL | 0 refills | Status: AC
Start: 2024-02-03 — End: 2024-02-03

## 2024-02-29 ENCOUNTER — Ambulatory Visit (HOSPITAL_COMMUNITY): Payer: Self-pay | Admitting: Anesthesiology

## 2024-02-29 ENCOUNTER — Ambulatory Visit (HOSPITAL_COMMUNITY)
Admission: RE | Admit: 2024-02-29 | Discharge: 2024-02-29 | Disposition: A | Discharge status: Home / Self Care | Attending: Internal Medicine | Admitting: Internal Medicine

## 2024-02-29 ENCOUNTER — Other Ambulatory Visit: Payer: Self-pay

## 2024-02-29 ENCOUNTER — Encounter (HOSPITAL_COMMUNITY): Payer: Self-pay | Admitting: Internal Medicine

## 2024-02-29 ENCOUNTER — Encounter (HOSPITAL_COMMUNITY)
Admission: RE | Disposition: A | Discharge status: Home / Self Care | Payer: Self-pay | Source: Home / Self Care | Attending: Internal Medicine

## 2024-02-29 DIAGNOSIS — K648 Other hemorrhoids: Secondary | ICD-10-CM | POA: Diagnosis not present

## 2024-02-29 DIAGNOSIS — J189 Pneumonia, unspecified organism: Secondary | ICD-10-CM | POA: Diagnosis not present

## 2024-02-29 DIAGNOSIS — K2270 Barrett's esophagus without dysplasia: Secondary | ICD-10-CM | POA: Diagnosis not present

## 2024-02-29 DIAGNOSIS — K635 Polyp of colon: Secondary | ICD-10-CM | POA: Diagnosis not present

## 2024-02-29 DIAGNOSIS — R1310 Dysphagia, unspecified: Secondary | ICD-10-CM | POA: Diagnosis not present

## 2024-02-29 DIAGNOSIS — R1013 Epigastric pain: Secondary | ICD-10-CM | POA: Insufficient documentation

## 2024-02-29 DIAGNOSIS — K2289 Other specified disease of esophagus: Secondary | ICD-10-CM | POA: Insufficient documentation

## 2024-02-29 DIAGNOSIS — D122 Benign neoplasm of ascending colon: Secondary | ICD-10-CM | POA: Diagnosis not present

## 2024-02-29 DIAGNOSIS — Z1211 Encounter for screening for malignant neoplasm of colon: Secondary | ICD-10-CM | POA: Insufficient documentation

## 2024-02-29 DIAGNOSIS — D123 Benign neoplasm of transverse colon: Secondary | ICD-10-CM | POA: Insufficient documentation

## 2024-02-29 DIAGNOSIS — K208 Other esophagitis without bleeding: Secondary | ICD-10-CM | POA: Insufficient documentation

## 2024-02-29 DIAGNOSIS — K200 Eosinophilic esophagitis: Secondary | ICD-10-CM | POA: Diagnosis not present

## 2024-02-29 DIAGNOSIS — R6881 Early satiety: Secondary | ICD-10-CM | POA: Insufficient documentation

## 2024-02-29 DIAGNOSIS — K581 Irritable bowel syndrome with constipation: Secondary | ICD-10-CM | POA: Insufficient documentation

## 2024-02-29 DIAGNOSIS — R1011 Right upper quadrant pain: Secondary | ICD-10-CM | POA: Insufficient documentation

## 2024-02-29 DIAGNOSIS — K222 Esophageal obstruction: Secondary | ICD-10-CM | POA: Diagnosis not present

## 2024-02-29 HISTORY — PX: COLONOSCOPY: SHX5424

## 2024-02-29 HISTORY — PX: ESOPHAGEAL DILATION: SHX303

## 2024-02-29 HISTORY — PX: ESOPHAGOGASTRODUODENOSCOPY: SHX5428

## 2024-02-29 SURGERY — COLONOSCOPY
Anesthesia: General

## 2024-02-29 MED ORDER — PHENYLEPHRINE 80 MCG/ML (10ML) SYRINGE FOR IV PUSH (FOR BLOOD PRESSURE SUPPORT)
PREFILLED_SYRINGE | INTRAVENOUS | Status: DC | PRN
  Administered 2024-02-29: 160 ug via INTRAVENOUS
  Administered 2024-02-29: 80 ug via INTRAVENOUS
  Administered 2024-02-29 (×2): 160 ug via INTRAVENOUS
  Administered 2024-02-29 (×2): 80 ug via INTRAVENOUS

## 2024-02-29 MED ORDER — PROPOFOL 500 MG/50ML IV EMUL
INTRAVENOUS | Status: DC | PRN
  Administered 2024-02-29: 80 mg via INTRAVENOUS
  Administered 2024-02-29: 180 ug/kg/min via INTRAVENOUS

## 2024-02-29 MED ORDER — EPHEDRINE SULFATE-NACL 50-0.9 MG/10ML-% IV SOSY
PREFILLED_SYRINGE | INTRAVENOUS | Status: DC | PRN
Start: 2024-02-29 — End: 2024-02-29
  Administered 2024-02-29: 10 mg via INTRAVENOUS
  Administered 2024-02-29: 5 mg via INTRAVENOUS
  Administered 2024-02-29: 10 mg via INTRAVENOUS

## 2024-02-29 MED ORDER — LIDOCAINE 2% (20 MG/ML) 5 ML SYRINGE
INTRAMUSCULAR | Status: DC | PRN
  Administered 2024-02-29: 50 mg via INTRAVENOUS

## 2024-02-29 MED ORDER — PHENYLEPHRINE 80 MCG/ML (10ML) SYRINGE FOR IV PUSH (FOR BLOOD PRESSURE SUPPORT)
PREFILLED_SYRINGE | INTRAVENOUS | Status: AC
  Filled 2024-02-29: qty 10

## 2024-02-29 MED ORDER — LIDOCAINE 2% (20 MG/ML) 5 ML SYRINGE
INTRAMUSCULAR | Status: AC
  Filled 2024-02-29: qty 5

## 2024-02-29 MED ORDER — LACTATED RINGERS IV SOLN: INTRAVENOUS | Status: DC

## 2024-02-29 MED ORDER — PROPOFOL 500 MG/50ML IV EMUL
INTRAVENOUS | Status: AC
  Filled 2024-02-29: qty 50

## 2024-02-29 NOTE — Discharge Instructions (Addendum)
 EGD Discharge instructions Please read the instructions outlined below and refer to this sheet in the next few weeks. These discharge instructions provide you with general information on caring for yourself after you leave the hospital. Your doctor may also give you specific instructions. While your treatment has been planned according to the most current medical practices available, unavoidable complications occasionally occur. If you have any problems or questions after discharge, please call your doctor. ACTIVITY You may resume your regular activity but move at a slower pace for the next 24 hours.  Take frequent rest periods for the next 24 hours.  Walking will help expel (get rid of) the air and reduce the bloated feeling in your abdomen.  No driving for 24 hours (because of the anesthesia (medicine) used during the test).  You may shower.  Do not sign any important legal documents or operate any machinery for 24 hours (because of the anesthesia used during the test).  NUTRITION Drink plenty of fluids.  You may resume your normal diet.  Begin with a light meal and progress to your normal diet.  Avoid alcoholic beverages for 24 hours or as instructed by your caregiver.  MEDICATIONS You may resume your normal medications unless your caregiver tells you otherwise.  WHAT YOU CAN EXPECT TODAY You may experience abdominal discomfort such as a feeling of fullness or "gas" pains.  FOLLOW-UP Your doctor will discuss the results of your test with you.  SEEK IMMEDIATE MEDICAL ATTENTION IF ANY OF THE FOLLOWING OCCUR: Excessive nausea (feeling sick to your stomach) and/or vomiting.  Severe abdominal pain and distention (swelling).  Trouble swallowing.  Temperature over 101 F (37.8 C).  Rectal bleeding or vomiting of blood.     Colonoscopy Discharge Instructions  Read the instructions outlined below and refer to this sheet in the next few weeks. These discharge instructions provide you with  general information on caring for yourself after you leave the hospital. Your doctor may also give you specific instructions. While your treatment has been planned according to the most current medical practices available, unavoidable complications occasionally occur.   ACTIVITY You may resume your regular activity, but move at a slower pace for the next 24 hours.  Take frequent rest periods for the next 24 hours.  Walking will help get rid of the air and reduce the bloated feeling in your belly (abdomen).  No driving for 24 hours (because of the medicine (anesthesia) used during the test).   Do not sign any important legal documents or operate any machinery for 24 hours (because of the anesthesia used during the test).  NUTRITION Drink plenty of fluids.  You may resume your normal diet as instructed by your doctor.  Begin with a light meal and progress to your normal diet. Heavy or fried foods are harder to digest and may make you feel sick to your stomach (nauseated).  Avoid alcoholic beverages for 24 hours or as instructed.  MEDICATIONS You may resume your normal medications unless your doctor tells you otherwise.  WHAT YOU CAN EXPECT TODAY Some feelings of bloating in the abdomen.  Passage of more gas than usual.  Spotting of blood in your stool or on the toilet paper.  IF YOU HAD POLYPS REMOVED DURING THE COLONOSCOPY: No aspirin products for 7 days or as instructed.  No alcohol for 7 days or as instructed.  Eat a soft diet for the next 24 hours.  FINDING OUT THE RESULTS OF YOUR TEST Not all test results are  available during your visit. If your test results are not back during the visit, make an appointment with your caregiver to find out the results. Do not assume everything is normal if you have not heard from your caregiver or the medical facility. It is important for you to follow up on all of your test results.  SEEK IMMEDIATE MEDICAL ATTENTION IF: You have more than a spotting of  blood in your stool.  Your belly is swollen (abdominal distention).  You are nauseated or vomiting.  You have a temperature over 101.  You have abdominal pain or discomfort that is severe or gets worse throughout the day.   Your upper endoscopy revealed a tightening of your esophagus called a Schatzki's ring.  I dilated this today.  Hopefully this helps with your swallowing.  Also appears that you have Barrett's esophagus.  I am concerned given the nodularity of this area.  I took ample samples of this region.  We will call with these results in a few days.  Stomach and small bowel were normal.  Your colonoscopy revealed 2 polyp(s) which I removed successfully. Await pathology results, my office will contact you. I recommend repeating colonoscopy in 7 years for surveillance purposes.   Follow up in GI office in 8 weeks. Message sent to office  I hope you have a great rest of your week!  Carlin POUR. Cindie, D.O. Gastroenterology and Hepatology [facility]

## 2024-02-29 NOTE — Op Note (Signed)
 [facility] Patient Name: Joseph Lozano Procedure Date: 02/29/2024 11:16 AM MRN: [id number] Date of Birth: 07/27/1971 Attending MD: Carlin POUR. Cindie , [place], [id number] CSN: [id number] Age: 53 Admit Type: Outpatient Procedure:                Upper GI endoscopy Indications:              Epigastric abdominal pain, Dysphagia Providers:                Carlin POUR. Cindie, DO, Rosina Sprague, Jon Loge Referring MD:              Medicines:                See the Anesthesia note for documentation of the                            administered medications Complications:            No immediate complications. Estimated Blood Loss:     Estimated blood loss was minimal. Procedure:                Pre-Anesthesia Assessment:                           - The anesthesia plan was to use monitored                            anesthesia care (MAC).                           After obtaining informed consent, the endoscope was                            passed under direct vision. Throughout the                            procedure, the patient's blood pressure, pulse, and                            oxygen saturations were monitored continuously. The                            [id number]-H190 ([id number]) scope was introduced through the                            mouth, and advanced to the second part of duodenum.                            The upper GI endoscopy was accomplished without                            difficulty. The patient tolerated the procedure                            well. Scope In: 11:31:54 AM  Scope Out: 11:40:08 AM Total Procedure Duration: 0 hours 8 minutes 14 seconds  Findings:      Mucosal changes including ringed esophagus were found in the middle       third of the esophagus. Biopsies were taken with a cold forceps for       histology.      The esophagus and gastroesophageal junction were examined with white       light and narrow band imaging (NBI)  from a forward view and retroflexed       position. Barrett's esophagus was present. Nodularity was present from       38 to 39 cm, somehwat masslike. Biopsies were taken with a cold forceps       for histology.      The entire examined stomach was normal.      The duodenal bulb, first portion of the duodenum and second portion of       the duodenum were normal.      A mild Schatzki ring was found in the distal esophagus. A TTS dilator       was passed through the scope. Dilation with an 18-19-20 mm balloon       dilator was performed to 20 mm. The dilation site was examined and       showed mild mucosal disruption and moderate improvement in luminal       narrowing. Impression:               - Esophageal mucosal changes. Biopsied.                           - Barrett's esophagus. Biopsied.                           - Normal stomach.                           - Normal duodenal bulb, first portion of the                            duodenum and second portion of the duodenum.                           - Mild Schatzki ring. Dilated. Moderate Sedation:      Per Anesthesia Care Recommendation:           - Patient has a contact number available for                            emergencies. The signs and symptoms of potential                            delayed complications were discussed with the                            patient. Return to normal activities tomorrow.                            Written discharge instructions were provided to the  patient.                           - Resume previous diet.                           - Continue present medications.                           - Await pathology results.                           - Repeat upper endoscopy for surveillance based on                            pathology results.                           - Return to GI clinic in 8 weeks. Procedure Code(s):        --- Professional ---                            [phone], Esophagogastroduodenoscopy, flexible,                            transoral; with transendoscopic balloon dilation of                            esophagus (less than 30 mm diameter)                           43239, 59, Esophagogastroduodenoscopy, flexible,                            transoral; with biopsy, single or multiple Diagnosis Code(s):        --- Professional ---                           K22.89, Other specified disease of esophagus                           K22.70, Barrett's esophagus without dysplasia                           K22.2, Esophageal obstruction                           R10.13, Epigastric pain                           R13.10, Dysphagia, unspecified CPT copyright 2022 American Medical Association. All rights reserved. The codes documented in this report are preliminary and upon coder review may  be revised to meet current compliance requirements. Carlin POUR. Cindie, DO Carlin POUR. Cindie, DO 02/29/2024 11:43:44 AM This report has been signed electronically. Number of Addenda: 0

## 2024-02-29 NOTE — Op Note (Signed)
 [facility] Patient Name: Joseph Lozano Procedure Date: 02/29/2024 11:12 AM MRN: [id number] Date of Birth: 1970-09-30 Attending MD: Carlin POUR. Cindie , [place], [id number] CSN: [id number] Age: 53 Admit Type: Outpatient Procedure:                Colonoscopy Indications:              Surveillance: Personal history of adenomatous                            polyps on last colonoscopy > 5 years ago Providers:                Carlin POUR. Cindie, DO, Rosina Sprague, Jon Loge Referring MD:              Medicines:                See the Anesthesia note for documentation of the                            administered medications Complications:            No immediate complications. Estimated Blood Loss:     Estimated blood loss was minimal. Procedure:                Pre-Anesthesia Assessment:                           - The anesthesia plan was to use monitored                            anesthesia care (MAC).                           After obtaining informed consent, the colonoscope                            was passed under direct vision. Throughout the                            procedure, the patient's blood pressure, pulse, and                            oxygen saturations were monitored continuously. The                            [id number]-HQ190L ([id number]) scope was introduced through                            the anus and advanced to the the terminal ileum,                            with identification of the appendiceal orifice and                            IC valve. The colonoscopy was  performed without                            difficulty. The patient tolerated the procedure                            well. The quality of the bowel preparation was                            evaluated using the BBPS [facility] Bowel Preparation                            Scale) with scores of: Right Colon = 3, Transverse                            Colon = 3 and Left Colon =  3 (entire mucosa seen                            well with no residual staining, small fragments of                            stool or opaque liquid). The total BBPS score                            equals 9. Scope In: 11:44:35 AM Scope Out: 11:54:44 AM Scope Withdrawal Time: 0 hours 8 minutes 52 seconds  Total Procedure Duration: 0 hours 10 minutes 9 seconds  Findings:      Non-bleeding internal hemorrhoids were found.      A 3 mm polyp was found in the ascending colon. The polyp was sessile.       The polyp was removed with a cold snare. Resection and retrieval were       complete.      A 6 mm polyp was found in the transverse colon. The polyp was sessile.       The polyp was removed with a cold snare. Resection and retrieval were       complete.      The terminal ileum appeared normal.      The exam was otherwise without abnormality on direct and retroflexion       views. Impression:               - Non-bleeding internal hemorrhoids.                           - One 3 mm polyp in the ascending colon, removed                            with a cold snare. Resected and retrieved.                           - One 5 mm polyp in the transverse colon, removed                            with a cold snare. Resected and retrieved.                           -  The examined portion of the ileum was normal.                           - The examination was otherwise normal on direct                            and retroflexion views. Moderate Sedation:      Per Anesthesia Care Recommendation:           - Patient has a contact number available for                            emergencies. The signs and symptoms of potential                            delayed complications were discussed with the                            patient. Return to normal activities tomorrow.                            Written discharge instructions were provided to the                            patient.                            - Resume previous diet.                           - Continue present medications.                           - Await pathology results.                           - Repeat colonoscopy in 7 years for surveillance.                           - Return to GI clinic in 8 weeks. Procedure Code(s):        --- Professional ---                           [phone], Colonoscopy, flexible; with removal of                            tumor(s), polyp(s), or other lesion(s) by snare                            technique Diagnosis Code(s):        --- Professional ---                           Z86.010, Personal history of colonic polyps                           D12.2, Benign  neoplasm of ascending colon                           D12.3, Benign neoplasm of transverse colon (hepatic                            flexure or splenic flexure)                           K64.8, Other hemorrhoids CPT copyright 2022 American Medical Association. All rights reserved. The codes documented in this report are preliminary and upon coder review may  be revised to meet current compliance requirements. Carlin POUR. Cindie, DO Carlin POUR. Cindie, DO 02/29/2024 11:58:15 AM This report has been signed electronically. Number of Addenda: 0

## 2024-02-29 NOTE — Anesthesia Preprocedure Evaluation (Signed)
 Anesthesia Evaluation  Patient identified by MRN, date of birth, ID band Patient awake    Reviewed: Allergy & Precautions, H&P , NPO status , Patient's Chart, lab work & pertinent test results, reviewed documented beta blocker date and time   Airway Mallampati: II  TM Distance: >3 FB Neck ROM: full    Dental no notable dental hx.    Pulmonary neg pulmonary ROS, pneumonia   Pulmonary exam normal breath sounds clear to auscultation       Cardiovascular Exercise Tolerance: Good hypertension, negative cardio ROS  Rhythm:regular Rate:Normal     Neuro/Psych  Neuromuscular disease negative neurological ROS  negative psych ROS   GI/Hepatic negative GI ROS, Neg liver ROS,,,  Endo/Other  negative endocrine ROS    Renal/GU Renal diseasenegative Renal ROS  negative genitourinary   Musculoskeletal   Abdominal   Peds  Hematology negative hematology ROS (+)   Anesthesia Other Findings   Reproductive/Obstetrics negative OB ROS                              Anesthesia Physical Anesthesia Plan  ASA: 2  Anesthesia Plan: General   Post-op Pain Management:    Induction:   PONV Risk Score and Plan: Propofol  infusion  Airway Management Planned:   Additional Equipment:   Intra-op Plan:   Post-operative Plan:   Informed Consent: I have reviewed the patients History and Physical, chart, labs and discussed the procedure including the risks, benefits and alternatives for the proposed anesthesia with the patient or authorized representative who has indicated his/her understanding and acceptance.     Dental Advisory Given  Plan Discussed with: CRNA  Anesthesia Plan Comments:         Anesthesia Quick Evaluation

## 2024-02-29 NOTE — Transfer of Care (Signed)
 Immediate Anesthesia Transfer of Care Note  Patient: Joseph Lozano  Procedure(s) Performed: COLONOSCOPY EGD (ESOPHAGOGASTRODUODENOSCOPY) DILATION, ESOPHAGUS  Patient Location: Endoscopy Unit  Anesthesia Type:General  Level of Consciousness: awake, alert , and oriented  Airway & Oxygen Therapy: Patient Spontanous Breathing  Post-op Assessment: Report given to RN, Post -op Vital signs reviewed and stable, Patient moving all extremities X 4, and Patient able to stick tongue midline  Post vital signs: Reviewed and stable   Last Vitals:  Vitals Value Taken Time  BP 121/74 02/29/24 11:59  Temp 36.7 C 02/29/24 11:59  Pulse 96 02/29/24 11:59  Resp 21 02/29/24 11:59  SpO2 98 % 02/29/24 11:59    Last Pain:  Vitals:   02/29/24 1159  TempSrc: Oral  PainSc: 0-No pain      Patients Stated Pain Goal: 5 (02/29/24 1010)  Complications: No notable events documented.

## 2024-02-29 NOTE — Interval H&P Note (Signed)
 History and Physical Interval Note:  02/29/2024 11:17 AM  Joseph Lozano  has presented today for surgery, with the diagnosis of history of polyps,dysphagia,epigastric pain, change in bowels.  The various methods of treatment have been discussed with the patient and family. After consideration of risks, benefits and other options for treatment, the patient has consented to  Procedure(s) with comments: COLONOSCOPY (N/A) - 11:45 am, asa 2 EGD (ESOPHAGOGASTRODUODENOSCOPY) (N/A) DILATION, ESOPHAGUS (N/A) as a surgical intervention.  The patient's history has been reviewed, patient examined, no change in status, stable for surgery.  I have reviewed the patient's chart and labs.  Questions were answered to the patient's satisfaction.     Joseph Lozano

## 2024-03-01 ENCOUNTER — Encounter (HOSPITAL_COMMUNITY): Payer: Self-pay | Admitting: Internal Medicine

## 2024-03-01 LAB — SURGICAL PATHOLOGY

## 2024-03-01 NOTE — Anesthesia Postprocedure Evaluation (Signed)
 Anesthesia Post Note  Patient: Joseph Lozano  Procedure(s) Performed: COLONOSCOPY EGD (ESOPHAGOGASTRODUODENOSCOPY) DILATION, ESOPHAGUS  Patient location during evaluation: Phase II Anesthesia Type: General Level of consciousness: awake Pain management: pain level controlled Vital Signs Assessment: post-procedure vital signs reviewed and stable Respiratory status: spontaneous breathing and respiratory function stable Cardiovascular status: blood pressure returned to baseline and stable Postop Assessment: no headache and no apparent nausea or vomiting Anesthetic complications: no Comments: Late entry   No notable events documented.   Last Vitals:  Vitals:   02/29/24 1010 02/29/24 1159  BP: 122/77 121/74  Pulse: 73 96  Resp: 12 (!) 21  Temp: (!) 36.4 C 36.7 C  SpO2: 98% 98%    Last Pain:  Vitals:   02/29/24 1159  TempSrc: Oral  PainSc: 0-No pain                 Yvonna JINNY Bosworth

## 2024-03-02 ENCOUNTER — Ambulatory Visit: Payer: Self-pay | Admitting: Internal Medicine

## 2024-03-02 MED ORDER — PANTOPRAZOLE SODIUM 40 MG PO TBEC: 40.0000 mg | DELAYED_RELEASE_TABLET | Freq: Two times a day (BID) | ORAL | 11 refills | Status: AC

## 2024-03-03 NOTE — Progress Notes (Signed)
 noted

## 2024-03-07 ENCOUNTER — Other Ambulatory Visit: Payer: Self-pay | Admitting: Medical Genetics

## 2024-03-08 ENCOUNTER — Other Ambulatory Visit (HOSPITAL_COMMUNITY)

## 2024-04-25 ENCOUNTER — Telehealth: Payer: Self-pay | Admitting: Gastroenterology

## 2024-04-25 ENCOUNTER — Encounter: Payer: Self-pay | Admitting: Gastroenterology

## 2024-04-25 ENCOUNTER — Ambulatory Visit (INDEPENDENT_AMBULATORY_CARE_PROVIDER_SITE_OTHER): Admitting: Gastroenterology

## 2024-04-25 VITALS — BP 135/88 | HR 69 | Temp 98.8°F | Ht 70.0 in | Wt 202.4 lb

## 2024-04-25 DIAGNOSIS — K200 Eosinophilic esophagitis: Secondary | ICD-10-CM | POA: Diagnosis not present

## 2024-04-25 DIAGNOSIS — R1319 Other dysphagia: Secondary | ICD-10-CM

## 2024-04-25 DIAGNOSIS — L219 Seborrheic dermatitis, unspecified: Secondary | ICD-10-CM | POA: Diagnosis not present

## 2024-04-25 DIAGNOSIS — K222 Esophageal obstruction: Secondary | ICD-10-CM | POA: Diagnosis not present

## 2024-04-25 DIAGNOSIS — K2289 Other specified disease of esophagus: Secondary | ICD-10-CM | POA: Diagnosis not present

## 2024-04-25 DIAGNOSIS — Z860100 Personal history of colon polyps, unspecified: Secondary | ICD-10-CM

## 2024-04-25 NOTE — Telephone Encounter (Signed)
 Please let patient know that Dr. Cindie is advising EGD+/-ED to look back at esophageal mass-like lesion between 8-12 weeks from the last EGD. Added possible esophageal dilation in case significant stricture found given EOE and Schatzki ring before, but overall he denies ongoing dysphagia.  So he can schedule anytime he is ready. ASA2.

## 2024-04-25 NOTE — Patient Instructions (Signed)
 Continue pantoprazole  40mg  twice daily for now. We will consider decreasing to once daily later, once we determine course of action regarding the esophageal mass.   Monitor for recurrent swallowing difficulties.  Monitor for recurrent/persistent abdominal pain. If ongoing issues, may require additional work up.   We will be in touch with further recommendations about surveillance of the esophageal mass once discussed with Dr. Cindie.
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
## 2024-04-26 NOTE — Telephone Encounter (Signed)
 Lmom for pt to return call

## 2024-04-26 NOTE — Telephone Encounter (Signed)
Pt was made aware and verbalized understanding. Pt is ready to move forward with scheduling.  

## 2024-04-26 NOTE — Telephone Encounter (Signed)
 LMOVM to call back

## 2024-04-27 NOTE — Telephone Encounter (Signed)
 Spoke with pt. Scheduled for 10/21. Aware will send instructions to him.

## 2024-05-23 ENCOUNTER — Encounter (HOSPITAL_COMMUNITY): Payer: Self-pay | Admitting: Internal Medicine

## 2024-05-23 ENCOUNTER — Ambulatory Visit (HOSPITAL_COMMUNITY)
Admission: RE | Admit: 2024-05-23 | Discharge: 2024-05-23 | Disposition: A | Discharge status: Home / Self Care | Attending: Internal Medicine | Admitting: Internal Medicine

## 2024-05-23 ENCOUNTER — Ambulatory Visit (HOSPITAL_COMMUNITY): Admitting: Certified Registered"

## 2024-05-23 ENCOUNTER — Other Ambulatory Visit: Payer: Self-pay

## 2024-05-23 ENCOUNTER — Encounter (HOSPITAL_COMMUNITY)
Admission: RE | Disposition: A | Discharge status: Home / Self Care | Payer: Self-pay | Source: Home / Self Care | Attending: Internal Medicine

## 2024-05-23 DIAGNOSIS — K222 Esophageal obstruction: Secondary | ICD-10-CM | POA: Diagnosis not present

## 2024-05-23 DIAGNOSIS — K648 Other hemorrhoids: Secondary | ICD-10-CM | POA: Insufficient documentation

## 2024-05-23 DIAGNOSIS — K2100 Gastro-esophageal reflux disease with esophagitis, without bleeding: Secondary | ICD-10-CM | POA: Diagnosis not present

## 2024-05-23 DIAGNOSIS — K2289 Other specified disease of esophagus: Secondary | ICD-10-CM | POA: Diagnosis not present

## 2024-05-23 DIAGNOSIS — K200 Eosinophilic esophagitis: Secondary | ICD-10-CM | POA: Diagnosis not present

## 2024-05-23 DIAGNOSIS — N289 Disorder of kidney and ureter, unspecified: Secondary | ICD-10-CM | POA: Diagnosis not present

## 2024-05-23 DIAGNOSIS — I10 Essential (primary) hypertension: Secondary | ICD-10-CM | POA: Insufficient documentation

## 2024-05-23 DIAGNOSIS — G8929 Other chronic pain: Secondary | ICD-10-CM | POA: Insufficient documentation

## 2024-05-23 DIAGNOSIS — Z79899 Other long term (current) drug therapy: Secondary | ICD-10-CM | POA: Diagnosis not present

## 2024-05-23 HISTORY — PX: ESOPHAGEAL DILATION: SHX303

## 2024-05-23 HISTORY — PX: ESOPHAGOGASTRODUODENOSCOPY: SHX5428

## 2024-05-23 SURGERY — EGD (ESOPHAGOGASTRODUODENOSCOPY)
Anesthesia: General

## 2024-05-23 MED ORDER — LIDOCAINE HCL (CARDIAC) PF 100 MG/5ML IV SOSY
PREFILLED_SYRINGE | INTRAVENOUS | Status: DC | PRN
  Administered 2024-05-23: 50 mg via INTRAVENOUS

## 2024-05-23 MED ORDER — PROPOFOL 10 MG/ML IV BOLUS
INTRAVENOUS | Status: DC | PRN
  Administered 2024-05-23: 100 mg via INTRAVENOUS
  Administered 2024-05-23: 125 ug/kg/min via INTRAVENOUS

## 2024-05-23 MED ORDER — LACTATED RINGERS IV SOLN: INTRAVENOUS | Status: DC | PRN

## 2024-05-23 NOTE — Discharge Instructions (Addendum)
 EGD Discharge instructions Please read the instructions outlined below and refer to this sheet in the next few weeks. These discharge instructions provide you with general information on caring for yourself after you leave the hospital. Your doctor may also give you specific instructions. While your treatment has been planned according to the most current medical practices available, unavoidable complications occasionally occur. If you have any problems or questions after discharge, please call your doctor. ACTIVITY You may resume your regular activity but move at a slower pace for the next 24 hours.  Take frequent rest periods for the next 24 hours.  Walking will help expel (get rid of) the air and reduce the bloated feeling in your abdomen.  No driving for 24 hours (because of the anesthesia (medicine) used during the test).  You may shower.  Do not sign any important legal documents or operate any machinery for 24 hours (because of the anesthesia used during the test).  NUTRITION Drink plenty of fluids.  You may resume your normal diet.  Begin with a light meal and progress to your normal diet.  Avoid alcoholic beverages for 24 hours or as instructed by your caregiver.  MEDICATIONS You may resume your normal medications unless your caregiver tells you otherwise.  WHAT YOU CAN EXPECT TODAY You may experience abdominal discomfort such as a feeling of fullness or "gas" pains.  FOLLOW-UP Your doctor will discuss the results of your test with you.  SEEK IMMEDIATE MEDICAL ATTENTION IF ANY OF THE FOLLOWING OCCUR: Excessive nausea (feeling sick to your stomach) and/or vomiting.  Severe abdominal pain and distention (swelling).  Trouble swallowing.  Temperature over 101 F (37.8 C).  Rectal bleeding or vomiting of blood.   Overall, your esophagus looks much improved compared to prior.  The congestion/nodularity at the distal portion of your esophagus is no longer present.  I did resample this  area as well as the middle portion of your esophagus given your history of eosinophilic esophagitis.  Await pathology results, my office will contact you.  Stomach and small bowel appeared normal.  Continue on pantoprazole  twice daily.  Follow-up in GI office in 6 months.   I hope you have a great rest of your week!  Carlin POUR. Cindie, D.O. Gastroenterology and Hepatology [facility]

## 2024-05-23 NOTE — Anesthesia Preprocedure Evaluation (Signed)
 Anesthesia Evaluation  Patient identified by MRN, date of birth, ID band Patient awake    Reviewed: Allergy & Precautions, H&P , NPO status , Patient's Chart, lab work & pertinent test results, reviewed documented beta blocker date and time   Airway Mallampati: II  TM Distance: >3 FB Neck ROM: full    Dental no notable dental hx.    Pulmonary pneumonia   Pulmonary exam normal breath sounds clear to auscultation       Cardiovascular Exercise Tolerance: Good hypertension, negative cardio ROS  Rhythm:regular Rate:Normal     Neuro/Psych  Neuromuscular disease  negative psych ROS   GI/Hepatic negative GI ROS, Neg liver ROS,,,  Endo/Other  negative endocrine ROS    Renal/GU Renal disease  negative genitourinary   Musculoskeletal   Abdominal   Peds  Hematology negative hematology ROS (+)   Anesthesia Other Findings   Reproductive/Obstetrics negative OB ROS                              Anesthesia Physical Anesthesia Plan  ASA: 2  Anesthesia Plan: General   Post-op Pain Management:    Induction:   PONV Risk Score and Plan: Propofol  infusion  Airway Management Planned:   Additional Equipment:   Intra-op Plan:   Post-operative Plan:   Informed Consent: I have reviewed the patients History and Physical, chart, labs and discussed the procedure including the risks, benefits and alternatives for the proposed anesthesia with the patient or authorized representative who has indicated his/her understanding and acceptance.     Dental Advisory Given  Plan Discussed with: CRNA  Anesthesia Plan Comments:         Anesthesia Quick Evaluation

## 2024-05-23 NOTE — Op Note (Signed)
 [facility] Patient Name: Joseph Lozano Procedure Date: 05/23/2024 10:16 AM MRN: [id number] Date of Birth: 06-Aug-1970 Attending MD: Carlin POUR. Cindie , [place], [id number] CSN: [id number] Age: 53 Admit Type: Outpatient Procedure:                Upper GI endoscopy Indications:              Follow-up of reflux esophagitis, Follow-up of                            eosinophilic esophagitis Providers:                Carlin POUR. Cindie, DO, Harlene Lips, Devere Lodge Referring MD:              Medicines:                See the Anesthesia note for documentation of the                            administered medications Complications:            No immediate complications. Estimated Blood Loss:     Estimated blood loss was minimal. Procedure:                Pre-Anesthesia Assessment:                           - The anesthesia plan was to use monitored                            anesthesia care (MAC).                           After obtaining informed consent, the endoscope was                            passed under direct vision. Throughout the                            procedure, the patient's blood pressure, pulse, and                            oxygen saturations were monitored continuously. The                            [id number] ([id number]) Upper was introduced through                            the mouth, and advanced to the second part of                            duodenum. The upper GI endoscopy was accomplished                            without difficulty. The patient tolerated the                            procedure well.  Scope In: 10:29:36 AM Scope Out: 10:33:18 AM Total Procedure Duration: 0 hours 3 minutes 42 seconds  Findings:      Mucosal changes including ringed esophagus were found in the middle       third of the esophagus. Biopsies were taken with a cold forceps for       histology.      The Z-line was slightly irregular and was found 39 cm from the incisors.        Overall improved comapred to prior. Previously congested       mucosa/esophagitis no longer apparent. Biopsies were taken with a cold       forceps for histology.      The entire examined stomach was normal.      The duodenal bulb, first portion of the duodenum and second portion of       the duodenum were normal. Impression:               - Esophageal mucosal changes secondary to                            eosinophilic esophagitis. Biopsied.                           - Z-line irregular, 39 cm from the incisors.                            Biopsied.                           - Normal stomach.                           - Normal duodenal bulb, first portion of the                            duodenum and second portion of the duodenum. Moderate Sedation:      Per Anesthesia Care Recommendation:           - Patient has a contact number available for                            emergencies. The signs and symptoms of potential                            delayed complications were discussed with the                            patient. Return to normal activities tomorrow.                            Written discharge instructions were provided to the                            patient.                           - Resume previous diet.                           -  Continue present medications.                           - Await pathology results.                           - Return to GI clinic in 6 months. Procedure Code(s):        --- Professional ---                           [phone], Esophagogastroduodenoscopy, flexible,                            transoral; with biopsy, single or multiple Diagnosis Code(s):        --- Professional ---                           K20.0, Eosinophilic esophagitis                           K22.89, Other specified disease of esophagus                           K21.00, Gastro-esophageal reflux disease with                            esophagitis, without bleeding CPT  copyright 2022 [employer] Medical Association. All rights reserved. The codes documented in this report are preliminary and upon coder review may  be revised to meet current compliance requirements. Carlin POUR. Cindie, DO Carlin POUR. Seanmichael Salmons, DO 05/23/2024 10:38:11 AM This report has been signed electronically. Number of Addenda: 0

## 2024-05-23 NOTE — Interval H&P Note (Signed)
 History and Physical Interval Note:  05/23/2024 10:17 AM  Joseph Lozano  has presented today for surgery, with the diagnosis of EOE, Schatzki ring, esophageal mass like lesion.  The various methods of treatment have been discussed with the patient and family. After consideration of risks, benefits and other options for treatment, the patient has consented to  Procedure(s) with comments: EGD (ESOPHAGOGASTRODUODENOSCOPY) (N/A) - 11:30am DILATION, ESOPHAGUS (N/A) as a surgical intervention.  The patient's history has been reviewed, patient examined, no change in status, stable for surgery.  I have reviewed the patient's chart and labs.  Questions were answered to the patient's satisfaction.     Carlin MARLA Hasty

## 2024-05-23 NOTE — Transfer of Care (Signed)
 Immediate Anesthesia Transfer of Care Note  Patient: Joseph Lozano  Procedure(s) Performed: EGD (ESOPHAGOGASTRODUODENOSCOPY) DILATION, ESOPHAGUS  Patient Location: Endoscopy Unit  Anesthesia Type:General  Level of Consciousness: drowsy, patient cooperative, and responds to stimulation  Airway & Oxygen Therapy: Patient Spontanous Breathing  Post-op Assessment: Report given to RN and Post -op Vital signs reviewed and stable  Post vital signs: Reviewed and stable  Last Vitals:  Vitals Value Taken Time  BP 92/57 05/23/24 10:36  Temp 36.4 C 05/23/24 10:36  Pulse 71 05/23/24 10:36  Resp 13 05/23/24 10:36  SpO2 95 % 05/23/24 10:36    Last Pain:  Vitals:   05/23/24 1036  TempSrc: Oral  PainSc:       Patients Stated Pain Goal: 8 (05/23/24 1014)  Complications: No notable events documented.

## 2024-05-24 ENCOUNTER — Encounter (HOSPITAL_COMMUNITY): Payer: Self-pay | Admitting: Internal Medicine

## 2024-05-24 LAB — SURGICAL PATHOLOGY

## 2024-05-26 NOTE — Anesthesia Postprocedure Evaluation (Signed)
 Anesthesia Post Note  Patient: Joseph Lozano  Procedure(s) Performed: EGD (ESOPHAGOGASTRODUODENOSCOPY) DILATION, ESOPHAGUS  Patient location during evaluation: Phase II Anesthesia Type: General Level of consciousness: awake Pain management: pain level controlled Vital Signs Assessment: post-procedure vital signs reviewed and stable Respiratory status: spontaneous breathing and respiratory function stable Cardiovascular status: blood pressure returned to baseline and stable Postop Assessment: no headache and no apparent nausea or vomiting Anesthetic complications: no Comments: Late entry   No notable events documented.   Last Vitals:  Vitals:   05/23/24 1036 05/23/24 1042  BP: (!) 92/57 100/64  Pulse: 71 79  Resp: 13 15  Temp: 36.4 C   SpO2: 95% 95%    Last Pain:  Vitals:   05/23/24 1042  TempSrc:   PainSc: 0-No pain                 Yvonna JINNY Bosworth

## 2024-05-30 ENCOUNTER — Other Ambulatory Visit: Payer: Self-pay | Admitting: Medical Genetics

## 2024-05-30 DIAGNOSIS — Z006 Encounter for examination for normal comparison and control in clinical research program: Secondary | ICD-10-CM

## 2024-06-11 ENCOUNTER — Ambulatory Visit: Payer: Self-pay | Admitting: Internal Medicine

## 2024-07-06 ENCOUNTER — Encounter: Payer: Self-pay | Admitting: Nurse Practitioner

## 2024-07-06 ENCOUNTER — Ambulatory Visit: Payer: Self-pay | Admitting: Nurse Practitioner

## 2024-07-06 VITALS — BP 106/73 | Ht 70.0 in | Wt 199.1 lb

## 2024-07-06 DIAGNOSIS — J069 Acute upper respiratory infection, unspecified: Secondary | ICD-10-CM

## 2024-07-06 MED ORDER — AZITHROMYCIN 250 MG PO TABS: ORAL_TABLET | ORAL | 0 refills | Status: AC

## 2024-07-06 NOTE — Progress Notes (Signed)
 Subjective:    Patient ID: Joseph Lozano, male    DOB: 01/12/1971, 53 y.o.   MRN: [id number]  HPI Discussed the use of AI scribe software for clinical note transcription with the patient, who gave verbal consent to proceed.  History of Present Illness Joseph Lozano is a 53 year old male who presents with symptoms of a viral upper respiratory illness.  Symptoms began last Saturday with a headache and throat discomfort, followed by fatigue. By Sunday night, he developed a cough and chest discomfort. By Monday night, gastrointestinal symptoms such as nausea and diarrhea appeared, though he has not vomited. No further diarrhea today.   The cough is dry with occasional sputum production, accompanied by chest tightness and wheezing, particularly on Monday night. He has noticed increased snoring, which is unusual for him. His throat is irritated but not severely sore, and he continues to have a persistent headache described as 'kind of everywhere.'  He has not tested for COVID-19. He is taking DayQuil and NyQuil for symptom relief, which helps him sleep. He also takes pantoprazole  twice daily, which he started after he was told a mass was found at the junction of his stomach and esophagus that was not cancerous. He reports dizziness, which he attributes to the medication.  He has a history of using an albuterol  inhaler but has not needed it during this illness. He mentions sinus pressure and fluid in his ears, particularly on the left side, contributing to dizziness and ear discomfort.    Social History   Tobacco Use   Smoking status: Never    Passive exposure: Past   Smokeless tobacco: Never  Vaping Use   Vaping status: Never Used  Substance Use Topics   Alcohol use: No   Drug use: No        Objective:   Physical Exam Vitals and nursing note reviewed.  Constitutional:      General: He is not in acute distress. HENT:     Ears:     Comments: TMs retracted bilaterally. Clear  effusion, more on the left.     Mouth/Throat:     Mouth: Mucous membranes are moist.     Pharynx: Oropharynx is clear.     Comments: Slight cloudy PND noted.  Neck:     Comments: Anterior cervical adenopathy.  Cardiovascular:     Rate and Rhythm: Normal rate and regular rhythm.  Pulmonary:     Effort: Pulmonary effort is normal.     Breath sounds: Normal breath sounds.  Musculoskeletal:     Cervical back: Neck supple.  Lymphadenopathy:     Cervical: Cervical adenopathy present.  Skin:    General: Skin is warm and dry.  Neurological:     Mental Status: He is alert and oriented to person, place, and time.  Psychiatric:        Mood and Affect: Mood normal.        Behavior: Behavior normal.        Thought Content: Thought content normal.    Today's Vitals   07/06/24 1032  BP: 106/73  Weight: 199 lb 1.3 oz (90.3 kg)  Height: 5' 10 (1.778 m)   Body mass index is 28.57 kg/m.        Assessment & Plan:  1. Viral URI with cough (Primary) Viral upper respiratory infection Symptoms consistent with viral etiology, likely self-limiting. No antibiotics needed unless symptoms worsen. - Continue DayQuil and Nyquil for symptom relief. - Consider Mucinex DM to  thin mucus. - Antibiotic prescription on hold for potential use if symptoms worsen over the weekend. - Use albuterol  inhaler as needed for wheezing. Sinus congestion with pressure and fluid in ears, worse on left. No ear infection. Sinus drainage contributing to throat clearing and cough. - Use Flonase  or steroid nasal spray to open Eustachian tubes and alleviate ear pressure.  Return if symptoms worsen or fail to improve.
